# Patient Record
Sex: Female | Born: 2000
Health system: Southern US, Community
[De-identification: ages and names within clinical notes are randomized; demographics above are authoritative.]

## PROBLEM LIST (undated history)

## (undated) DIAGNOSIS — F419 Anxiety disorder, unspecified: Secondary | ICD-10-CM

## (undated) DIAGNOSIS — J302 Other seasonal allergic rhinitis: Secondary | ICD-10-CM

## (undated) DIAGNOSIS — J45909 Unspecified asthma, uncomplicated: Secondary | ICD-10-CM

## (undated) DIAGNOSIS — F32A Depression, unspecified: Secondary | ICD-10-CM

## (undated) HISTORY — DX: Other seasonal allergic rhinitis: J30.2

---
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
## 2020-01-14 DIAGNOSIS — F603 Borderline personality disorder: Secondary | ICD-10-CM | POA: Diagnosis not present

## 2020-01-21 DIAGNOSIS — F603 Borderline personality disorder: Secondary | ICD-10-CM | POA: Diagnosis not present

## 2020-02-12 DIAGNOSIS — F603 Borderline personality disorder: Secondary | ICD-10-CM | POA: Diagnosis not present

## 2020-03-11 DIAGNOSIS — F603 Borderline personality disorder: Secondary | ICD-10-CM | POA: Diagnosis not present

## 2020-03-18 DIAGNOSIS — F603 Borderline personality disorder: Secondary | ICD-10-CM | POA: Diagnosis not present

## 2020-03-19 ENCOUNTER — Ambulatory Visit: Payer: Self-pay

## 2020-03-20 ENCOUNTER — Telehealth (INDEPENDENT_AMBULATORY_CARE_PROVIDER_SITE_OTHER): Payer: BC Managed Care – PPO | Admitting: Family Medicine

## 2020-03-20 ENCOUNTER — Other Ambulatory Visit: Payer: Self-pay

## 2020-03-20 ENCOUNTER — Encounter: Payer: Self-pay | Admitting: Family Medicine

## 2020-03-20 DIAGNOSIS — F603 Borderline personality disorder: Secondary | ICD-10-CM | POA: Diagnosis not present

## 2020-03-20 DIAGNOSIS — N946 Dysmenorrhea, unspecified: Secondary | ICD-10-CM | POA: Diagnosis not present

## 2020-03-20 DIAGNOSIS — F32A Depression, unspecified: Secondary | ICD-10-CM

## 2020-03-20 DIAGNOSIS — F419 Anxiety disorder, unspecified: Secondary | ICD-10-CM

## 2020-03-20 DIAGNOSIS — F329 Major depressive disorder, single episode, unspecified: Secondary | ICD-10-CM

## 2020-03-20 MED ORDER — NORGESTIMATE-ETH ESTRADIOL 0.25-35 MG-MCG PO TABS: 1.0000 | ORAL_TABLET | Freq: Every day | ORAL | 1 refills | Status: DC

## 2020-03-20 MED ORDER — SERTRALINE HCL 100 MG PO TABS: 100.0000 mg | ORAL_TABLET | Freq: Every day | ORAL | 1 refills | Status: DC

## 2020-03-20 MED ORDER — BUPROPION HCL ER (XL) 150 MG PO TB24: 150.0000 mg | ORAL_TABLET | Freq: Every morning | ORAL | 1 refills | Status: DC

## 2020-03-20 NOTE — Progress Notes (Signed)
Virtual Visit via Video   I connected with patient on 03/20/20 at  8:00 AM EDT by a video enabled telemedicine application and verified that I am speaking with the correct person using two identifiers.  Location patient: Home Location provider: [facility], Office Persons participating in the virtual visit: Patient, Provider, CMA (Jess B)  I discussed the limitations of evaluation and management by telemedicine and the availability of in person appointments. The patient expressed understanding and agreed to proceed.  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  Majoring in [place].  Broke up w/ boyfriend just before summer.  A few months ago was 'dx'd w/ borderline personality disorder'.  Pt stopped seeing psych b/c 'she would change my prescription at every appt'.  Continues to see therapist regularly.  Currently on Bupropion XL 150mg  daily and Sertraline 100mg  daily.  Pt feels that medication is currently working and she feels in a good place.    Also needs refills on OCPs.  ROS:   See pertinent positives and negatives per HPI.  Patient Active Problem List   Diagnosis Date Noted   Dysmenorrhea in adolescent 11/10/2016   Anxiety and depression 11/10/2016   GERD (gastroesophageal reflux disease) 11/10/2016   Migraine without aura and without status migrainosus, not intractable 08/12/2016   Allergic reaction 11/07/2013    Social History   Tobacco Use   Smoking status: Never Smoker   Smokeless tobacco: Never Used  Substance Use Topics   Alcohol use: No    Current Outpatient Medications:    buPROPion (WELLBUTRIN XL) 150 MG 24 hr tablet, Take 150 mg by mouth every morning., Disp: , Rfl:    ferrous sulfate 325 (65 FE) MG tablet, Take 325 mg by mouth daily., Disp: , Rfl:    fexofenadine (ALLEGRA) 180 MG tablet, Take 180 mg by mouth daily., Disp: , Rfl:    norgestimate-ethinyl  estradiol (ESTARYLLA) 0.25-35 MG-MCG tablet, Take 1 tablet by mouth daily., Disp: 84 tablet, Rfl: 1   RISPERDAL 0.5 MG tablet, Take 0.5 mg by mouth 2 (two) times daily., Disp: , Rfl:    sertraline (ZOLOFT) 100 MG tablet, Take 100 mg by mouth daily., Disp: , Rfl:   Allergies  Allergen Reactions   Chocolate    Justicia Adhatoda Anaphylaxis   Peanut-Containing Drug Products Anaphylaxis   Avocado    Cat Hair Extract    Dog Epithelium    Grass Pollen(K-O-R-T-Swt Vern)    Other     Too much cheese/dairy- GI upset Tree nuts, Environmental Allergies, Feathers, Dogs, Cats    Peanut Butter Flavor Other (See Comments)    Throat pain and diarrhea    Pollen Extract     Objective:   There were no vitals taken for this visit.  AAOx3, NAD NCAT, EOMI No obvious CN deficits Coloring WNL Pt is able to speak clearly, coherently without shortness of breath or increased work of breathing.  Thought process is linear.  Mood is appropriate.   Assessment and Plan:   Anxiety/Depression- currently stable.  No longer seeing psychiatrist.  Asking for refills on Wellbutrin and Sertraline.  Not currently on mood stabilizer or anti-psychotic.  Doing well at college and is overall happy at this time.  Borderline personality disorder- new.  dx'd by therapist.  Pt feels that this explains 'a lot' about friendships and relationships.  Encouraged her to continue counseling.  Dysmenorrhea- pt needs refill on OCPs.   11/09/2013  Beverely Low, MD 03/20/2020

## 2020-03-20 NOTE — Progress Notes (Signed)
I have discussed the procedure for the virtual visit with the patient who has given consent to proceed with assessment and treatment.   Pt unable to obtain vitals.   Annette Hart, CMA     

## 2020-03-23 ENCOUNTER — Ambulatory Visit: Payer: Self-pay

## 2020-03-26 DIAGNOSIS — Z114 Encounter for screening for human immunodeficiency virus [HIV]: Secondary | ICD-10-CM | POA: Diagnosis not present

## 2020-03-26 DIAGNOSIS — J029 Acute pharyngitis, unspecified: Secondary | ICD-10-CM | POA: Diagnosis not present

## 2020-03-26 DIAGNOSIS — Z113 Encounter for screening for infections with a predominantly sexual mode of transmission: Secondary | ICD-10-CM | POA: Diagnosis not present

## 2020-03-26 DIAGNOSIS — J028 Acute pharyngitis due to other specified organisms: Secondary | ICD-10-CM | POA: Diagnosis not present

## 2020-04-01 DIAGNOSIS — J302 Other seasonal allergic rhinitis: Secondary | ICD-10-CM | POA: Diagnosis not present

## 2020-04-08 DIAGNOSIS — F603 Borderline personality disorder: Secondary | ICD-10-CM | POA: Diagnosis not present

## 2020-04-13 ENCOUNTER — Ambulatory Visit: Payer: Self-pay

## 2020-05-06 DIAGNOSIS — F603 Borderline personality disorder: Secondary | ICD-10-CM | POA: Diagnosis not present

## 2020-05-13 DIAGNOSIS — F603 Borderline personality disorder: Secondary | ICD-10-CM | POA: Diagnosis not present

## 2020-05-20 DIAGNOSIS — J069 Acute upper respiratory infection, unspecified: Secondary | ICD-10-CM | POA: Diagnosis not present

## 2020-05-21 DIAGNOSIS — Z113 Encounter for screening for infections with a predominantly sexual mode of transmission: Secondary | ICD-10-CM | POA: Diagnosis not present

## 2020-05-21 DIAGNOSIS — N899 Noninflammatory disorder of vagina, unspecified: Secondary | ICD-10-CM | POA: Diagnosis not present

## 2020-05-22 DIAGNOSIS — N899 Noninflammatory disorder of vagina, unspecified: Secondary | ICD-10-CM | POA: Diagnosis not present

## 2020-05-22 DIAGNOSIS — Z113 Encounter for screening for infections with a predominantly sexual mode of transmission: Secondary | ICD-10-CM | POA: Diagnosis not present

## 2020-05-27 ENCOUNTER — Encounter: Payer: Self-pay | Admitting: Physician Assistant

## 2020-05-27 ENCOUNTER — Telehealth (INDEPENDENT_AMBULATORY_CARE_PROVIDER_SITE_OTHER): Payer: BC Managed Care – PPO | Admitting: Physician Assistant

## 2020-05-27 ENCOUNTER — Other Ambulatory Visit: Payer: Self-pay

## 2020-05-27 ENCOUNTER — Telehealth: Payer: Self-pay | Admitting: Physician Assistant

## 2020-05-27 DIAGNOSIS — F603 Borderline personality disorder: Secondary | ICD-10-CM | POA: Diagnosis not present

## 2020-05-27 DIAGNOSIS — J029 Acute pharyngitis, unspecified: Secondary | ICD-10-CM

## 2020-05-27 MED ORDER — PREDNISONE 10 MG PO TABS: 30.0000 mg | ORAL_TABLET | Freq: Every day | ORAL | 0 refills | Status: DC

## 2020-05-27 MED ORDER — FLUCONAZOLE 150 MG PO TABS: 150.0000 mg | ORAL_TABLET | Freq: Once | ORAL | 0 refills | Status: AC

## 2020-05-27 MED ORDER — CEFDINIR 300 MG PO CAPS: 300.0000 mg | ORAL_CAPSULE | Freq: Two times a day (BID) | ORAL | 0 refills | Status: DC

## 2020-05-27 NOTE — Telephone Encounter (Signed)
Would you be willing to do an in office visit with this pt?   Annette Hart

## 2020-05-27 NOTE — Telephone Encounter (Signed)
At this time, we need to do respiratory illnesses via video visit to determine what the next best step is

## 2020-05-27 NOTE — Telephone Encounter (Signed)
Giving current URI symptoms -- would need to be video visit with me or she can see if PCP is willing to do in-office.

## 2020-05-27 NOTE — Telephone Encounter (Signed)
Pt called back asking if she can do a in office visit instead of a VV. She states that she had a covid test last week that came back neg. Please advise

## 2020-05-27 NOTE — Progress Notes (Signed)
Virtual Visit via Video   I connected with patient on 05/27/20 at  2:30 PM EST by a video enabled telemedicine application and verified that I am speaking with the correct person using two identifiers.  Location patient: Home Location provider: [place] [facility], Office Persons participating in the virtual visit: Patient, Provider, CMA (Patina Moore)  I discussed the limitations of evaluation and management by telemedicine and the availability of in person appointments. The patient expressed understanding and agreed to proceed.  Subjective:   HPI:   Patient presents via Care[facility] today c/o ongoing symptoms. Notes 2 weeks ago starting with mild sore throat, nasal congestion, fatigue and hoarseness of voice. Notes that her voice has returned. She is having fatigue, sore throat, bilateral swollen tonsils, odynophagia and aches. Notes decreased appetite. Denies fever but notes occasional sweating. Was COVID tested and this was negative. Denies loss of taste or smell. Denies chest congestion. Has been taking Mucinex-DM (not in past few days) and Tylenol for her sore throat.    ROS:  See pertinent positives and negatives per HPI.  Patient Active Problem List   Diagnosis Date Noted  . Borderline personality disorder (HCC) 03/20/2020  . Dysmenorrhea in adolescent 11/10/2016  . Anxiety and depression 11/10/2016  . GERD (gastroesophageal reflux disease) 11/10/2016  . Migraine without aura and without status migrainosus, not intractable 08/12/2016  . Allergic reaction 11/07/2013    Social History   Tobacco Use  . Smoking status: Never Smoker  . Smokeless tobacco: Never Used  Substance Use Topics  . Alcohol use: No    Current Outpatient Medications:  .  albuterol (VENTOLIN HFA) 108 (90 Base) MCG/ACT inhaler, Inhale 1-2 puffs into the lungs every 6 (six) hours as needed for wheezing or shortness of breath., Disp: , Rfl:  .  buPROPion (WELLBUTRIN XL) 150 MG 24 hr tablet, Take 1  tablet (150 mg total) by mouth every morning., Disp: 90 tablet, Rfl: 1 .  ferrous sulfate 325 (65 FE) MG tablet, Take 325 mg by mouth daily., Disp: , Rfl:  .  fexofenadine (ALLEGRA) 180 MG tablet, Take 180 mg by mouth daily., Disp: , Rfl:  .  norgestimate-ethinyl estradiol (ESTARYLLA) 0.25-35 MG-MCG tablet, Take 1 tablet by mouth daily., Disp: 84 tablet, Rfl: 1 .  sertraline (ZOLOFT) 100 MG tablet, Take 1 tablet (100 mg total) by mouth daily., Disp: 90 tablet, Rfl: 1  Allergies  Allergen Reactions  . Justicia Adhatoda Anaphylaxis  . Peanut-Containing Drug Products Anaphylaxis  . Avocado   . Cat Hair Extract   . Dog Epithelium   . Grass Pollen(K-O-R-T-Swt Vern)   . Other     Too much cheese/dairy- GI upset Tree nuts, Environmental Allergies, Feathers, Dogs, Cats   . Pollen Extract     Objective:   There were no vitals taken for this visit.  Patient is well-developed, well-nourished in no acute distress.  Resting comfortably at home.  Head is normocephalic, atraumatic.  No labored breathing.  Speech is clear and coherent with logical content.  Patient is alert and oriented at baseline.  + tonsillar exudate.   Assessment and Plan:   1. Pharyngitis, unspecified etiology Concern for bacterial pharyngitis giving current symptoms and exudate. Rx Cefdinir BID x 10 days and 3-day burst of Prednisone. Supportive measures and OTC medications reviewed. Follow-up if not improving.  - cefdinir (OMNICEF) 300 MG capsule; Take 1 capsule (300 mg total) by mouth 2 (two) times daily.  Dispense: 20 capsule; Refill: 0 - predniSONE (DELTASONE) 10 MG tablet;  Take 3 tablets (30 mg total) by mouth daily with breakfast.  Dispense: 9 tablet; Refill: 0    Piedad Climes, [place] 05/27/2020

## 2020-05-27 NOTE — Progress Notes (Signed)
I have discussed the procedure for the virtual visit with the patient who has given consent to proceed with assessment and treatment.   Tydarius Yawn S Makinsley Schiavi, CMA     

## 2020-05-27 NOTE — Telephone Encounter (Signed)
Pt is aware.  

## 2020-05-28 ENCOUNTER — Telehealth: Payer: Self-pay | Admitting: Family Medicine

## 2020-05-29 ENCOUNTER — Telehealth: Payer: Self-pay | Admitting: Family Medicine

## 2020-06-10 DIAGNOSIS — F603 Borderline personality disorder: Secondary | ICD-10-CM | POA: Diagnosis not present

## 2020-06-14 ENCOUNTER — Other Ambulatory Visit: Payer: Self-pay

## 2020-06-14 ENCOUNTER — Ambulatory Visit
Admission: EM | Admit: 2020-06-14 | Discharge: 2020-06-14 | Disposition: A | Discharge status: Home / Self Care | Payer: BC Managed Care – PPO | Attending: Urgent Care | Admitting: Urgent Care

## 2020-06-14 DIAGNOSIS — R1310 Dysphagia, unspecified: Secondary | ICD-10-CM | POA: Insufficient documentation

## 2020-06-14 DIAGNOSIS — R070 Pain in throat: Secondary | ICD-10-CM | POA: Insufficient documentation

## 2020-06-14 DIAGNOSIS — J029 Acute pharyngitis, unspecified: Secondary | ICD-10-CM | POA: Insufficient documentation

## 2020-06-14 DIAGNOSIS — J358 Other chronic diseases of tonsils and adenoids: Secondary | ICD-10-CM | POA: Diagnosis not present

## 2020-06-14 LAB — POCT RAPID STREP A (OFFICE): Rapid Strep A Screen: NEGATIVE

## 2020-06-14 MED ORDER — CLINDAMYCIN HCL 300 MG PO CAPS: 300.0000 mg | ORAL_CAPSULE | Freq: Three times a day (TID) | ORAL | 0 refills | Status: DC

## 2020-06-14 MED ORDER — PREDNISONE 20 MG PO TABS: ORAL_TABLET | ORAL | 0 refills | Status: DC

## 2020-06-14 MED ORDER — FLUCONAZOLE 150 MG PO TABS: 150.0000 mg | ORAL_TABLET | ORAL | 0 refills | Status: DC

## 2020-06-14 NOTE — ED Triage Notes (Signed)
Patient complains of worsening sore throat pain since Thursday and is just recovering from laryngitis. Pt states the pain has been worsening and it is now extremely painful to swallow eat or drink. Pt is aox4 and ambulatory.

## 2020-06-14 NOTE — Discharge Instructions (Signed)
Please call [facility] for a consultation regarding frequent tonsillitis and laryngitis.

## 2020-06-14 NOTE — ED Provider Notes (Signed)
[facility]   MRN: [id number] DOB: 12/26/2000  Subjective:   Annette Hart is a 19 y.o. female presenting for 3-day history of recurrent throat pain, painful swallowing, neck pain.  Patient has been struggling with laryngitis and frequent strep infections.  States that she has had about 5 this year already.  She has undergone a course of cefdinir and 3 days of prednisone and had some relief up until this past Thursday.  She has not seen a throat specialist.  Denies fever, cough, chest pain.  Is having significant difficulty eating and drinking.  No current facility-administered medications for this encounter.  Current Outpatient Medications:  .  albuterol (VENTOLIN HFA) 108 (90 Base) MCG/ACT inhaler, Inhale 1-2 puffs into the lungs every 6 (six) hours as needed for wheezing or shortness of breath., Disp: , Rfl:  .  buPROPion (WELLBUTRIN XL) 150 MG 24 hr tablet, Take 1 tablet (150 mg total) by mouth every morning., Disp: 90 tablet, Rfl: 1 .  fexofenadine (ALLEGRA) 180 MG tablet, Take 180 mg by mouth daily., Disp: , Rfl:  .  norgestimate-ethinyl estradiol (ESTARYLLA) 0.25-35 MG-MCG tablet, Take 1 tablet by mouth daily., Disp: 84 tablet, Rfl: 1 .  sertraline (ZOLOFT) 100 MG tablet, Take 1 tablet (100 mg total) by mouth daily., Disp: 90 tablet, Rfl: 1 .  cefdinir (OMNICEF) 300 MG capsule, Take 1 capsule (300 mg total) by mouth 2 (two) times daily., Disp: 20 capsule, Rfl: 0 .  ferrous sulfate 325 (65 FE) MG tablet, Take 325 mg by mouth daily., Disp: , Rfl:  .  predniSONE (DELTASONE) 10 MG tablet, Take 3 tablets (30 mg total) by mouth daily with breakfast., Disp: 9 tablet, Rfl: 0   Allergies  Allergen Reactions  . Justicia Adhatoda Anaphylaxis  . Peanut-Containing Drug Products Anaphylaxis  . Avocado   . Cat Hair Extract   . Dog Epithelium   . Grass Pollen(K-O-R-T-Swt Vern)   . Other     Too much cheese/dairy- GI upset Tree nuts, Environmental Allergies, Feathers, Dogs,  Cats   . Pollen Extract     Past Medical History:  Diagnosis Date  . Seasonal allergies      Past Surgical History:  Procedure Laterality Date  . UMBILICAL HERNIA REPAIR  2003    Family History  Problem Relation Age of Onset  . Hypertension Maternal Grandmother   . Diabetes Maternal Grandfather   . Arthritis Father   . Cataracts Father   . Glaucoma Father   . Migraines Father   . Headache Father   . Hypothyroidism Mother   . Headache Mother   . Migraines Mother   . Depression Mother   . Anxiety disorder Mother   . Schizophrenia Paternal Uncle   . Autism Other   . Seizures Neg Hx   . Bipolar disorder Neg Hx   . ADD / ADHD Neg Hx     Social History   Tobacco Use  . Smoking status: Never Smoker  . Smokeless tobacco: Never Used  Vaping Use  . Vaping Use: Never used  Substance Use Topics  . Alcohol use: No  . Drug use: No    ROS   Objective:   Vitals: BP 114/76 (BP Location: Right Arm)   Pulse 80   Temp 98.2 F (36.8 C) (Oral)   Resp 20   LMP 06/09/2020 (Approximate)   SpO2 98%   Physical Exam Constitutional:      General: She is not in acute distress.    Appearance: Normal  appearance. She is well-developed. She is not ill-appearing, toxic-appearing or diaphoretic.  HENT:     Head: Normocephalic and atraumatic.     Nose: Nose normal.     Mouth/Throat:     Mouth: Mucous membranes are moist.     Pharynx: Pharyngeal swelling and posterior oropharyngeal erythema present. No oropharyngeal exudate or uvula swelling.     Tonsils: No tonsillar exudate or tonsillar abscesses. 1+ on the right. 1+ on the left.  Eyes:     General: No scleral icterus.    Extraocular Movements: Extraocular movements intact.     Pupils: Pupils are equal, round, and reactive to light.  Cardiovascular:     Rate and Rhythm: Normal rate.  Pulmonary:     Effort: Pulmonary effort is normal.  Lymphadenopathy:     Cervical: Cervical adenopathy (bilaterally) present.  Skin:     General: Skin is warm and dry.  Neurological:     General: No focal deficit present.     Mental Status: She is alert and oriented to person, place, and time.  Psychiatric:        Mood and Affect: Mood normal.        Behavior: Behavior normal.     Results for orders placed or performed during the hospital encounter of 06/14/20 (from the past 24 hour(s))  POCT rapid strep A     Status: None   Collection Time: 06/14/20  9:31 AM  Result Value Ref Range   Rapid Strep A Screen Negative Negative    Assessment and Plan :   PDMP not reviewed this encounter.  1. Pharyngitis, unspecified etiology   2. Throat pain   3. Tonsillar erythema   4. Painful swallowing     Strep culture pending, will manage for pharyngitis of a different infectious etiology with clindamycin.  Recommended oral prednisone course in addition to this.  Emphasized need for follow-up with an ENT specialist. Counseled patient on potential for adverse effects with medications prescribed/recommended today, ER and return-to-clinic precautions discussed, patient verbalized understanding.    Wallis Bamberg, PA-C 06/14/20 1002

## 2020-06-16 LAB — CULTURE, GROUP A STREP (THRC)

## 2020-06-23 ENCOUNTER — Encounter: Payer: Self-pay | Admitting: Family Medicine

## 2020-06-23 ENCOUNTER — Ambulatory Visit (INDEPENDENT_AMBULATORY_CARE_PROVIDER_SITE_OTHER): Payer: BC Managed Care – PPO | Admitting: Family Medicine

## 2020-06-23 ENCOUNTER — Other Ambulatory Visit: Payer: Self-pay

## 2020-06-23 VITALS — BP 140/60 | HR 113 | Temp 98.6°F | Resp 17 | Ht 66.0 in | Wt 124.8 lb

## 2020-06-23 DIAGNOSIS — Z23 Encounter for immunization: Secondary | ICD-10-CM

## 2020-06-23 DIAGNOSIS — Z0000 Encounter for general adult medical examination without abnormal findings: Secondary | ICD-10-CM

## 2020-06-23 NOTE — Assessment & Plan Note (Signed)
PE WNL.  Pt has multiple concerns but has specialists for these issues (ENT for tonsils, eye doctor for L eye visual changes, GYN for menstrual issues).  Encouraged her to call and schedule appts to address these concerns.  No need for labs today based on lack of risk factors.  Flu shot given.  Anticipatory guidance provided.

## 2020-06-23 NOTE — Patient Instructions (Signed)
Follow up in 1 year or as needed No need for blood work today!  Annette Hart! Please call and schedule with ENT, eye doctor, and GYN Make sure you are drinking LOTS of water, limit your salt intake to improve blood pressure Call with any questions or concerns Stay Safe!  Stay Healthy! Happy Holidays!!

## 2020-06-23 NOTE — Progress Notes (Signed)
   Subjective:    Patient ID: Annette Hart, female    DOB: [birth year], 19 y.o.   MRN: [id number]  HPI CPE- UTD on Tdap, COVID.  Due for flu.  Too young for pap.  Reviewed past medical, surgical, family and social histories.   Health Maintenance  Topic Date Due  . INFLUENZA VACCINE  02/09/2020  . Hepatitis C Screening  03/20/2021 (Originally Aug 11, 2000)  . HIV Screening  03/20/2021 (Originally 05/07/2016)  . TETANUS/TDAP  01/15/2022  . COVID-19 Vaccine  Completed      Review of Systems Patient reports no hearing changes, adenopathy, fever, weight change,  persistant/recurrent hoarseness , swallowing issues, chest pain, palpitations, edema, persistant/recurrent cough, hemoptysis, dyspnea (rest/exertional/paroxysmal nocturnal), gastrointestinal bleeding (melena, rectal bleeding), abdominal pain, bowel changes, GU symptoms (dysuria, hematuria, incontinence), Gyn symptoms (abnormal  bleeding, pain),  syncope, focal weakness, memory loss, numbness & tingling, skin/hair/nail changes, abnormal bruising or bleeding.   dx'd w/ borderline personality d/o last spring L eye having difficulty differentiating colors + GERD + intermittent abdominal pain + constant back pain- worse w/ menstrual cycle  This visit occurred during the SARS-CoV-2 public health emergency.  Safety protocols were in place, including screening questions prior to the visit, additional usage of staff PPE, and extensive cleaning of exam room while observing appropriate contact time as indicated for disinfecting solutions.       Objective:   Physical Exam General Appearance:    Alert, cooperative, no distress, appears stated age  Head:    Normocephalic, without obvious abnormality, atraumatic  Eyes:    PERRL, conjunctiva/corneas clear, EOM's intact, fundi    benign, both eyes  Ears:    Normal TM's and external ear canals, both ears  Nose:   Deferred due to COVID  Throat:   Neck:   Supple, symmetrical, trachea midline,  no adenopathy;    Thyroid: no enlargement/tenderness/nodules  Back:     Symmetric, no curvature, ROM normal, no CVA tenderness  Lungs:     Clear to auscultation bilaterally, respirations unlabored  Chest Wall:    No tenderness or deformity   Heart:    Regular rate and rhythm, S1 and S2 normal, no murmur, rub   or gallop  Breast Exam:    Deferred to GYN  Abdomen:     Soft, non-tender, bowel sounds active all four quadrants,    no masses, no organomegaly  Genitalia:    Deferred to GYN  Rectal:    Extremities:   Extremities normal, atraumatic, no cyanosis or edema  Pulses:   2+ and symmetric all extremities  Skin:   Skin color, texture, turgor normal, no rashes or lesions  Lymph nodes:   Cervical, supraclavicular, and axillary nodes normal  Neurologic:   CNII-XII intact, normal strength, sensation and reflexes    throughout          Assessment & Plan:

## 2020-06-24 DIAGNOSIS — F603 Borderline personality disorder: Secondary | ICD-10-CM | POA: Diagnosis not present

## 2020-06-29 ENCOUNTER — Encounter: Payer: Self-pay | Admitting: *Deleted

## 2020-06-29 ENCOUNTER — Encounter: Payer: Self-pay | Admitting: Obstetrics & Gynecology

## 2020-06-29 NOTE — Progress Notes (Unsigned)
[facility] GYn visit for cycle issues. Per discussion with provider do not need to call patient ; but may be rescheduled if she calls. Raphael Espe,RN

## 2020-07-29 DIAGNOSIS — N899 Noninflammatory disorder of vagina, unspecified: Secondary | ICD-10-CM | POA: Diagnosis not present

## 2020-07-29 DIAGNOSIS — Z114 Encounter for screening for human immunodeficiency virus [HIV]: Secondary | ICD-10-CM | POA: Diagnosis not present

## 2020-07-29 DIAGNOSIS — Z113 Encounter for screening for infections with a predominantly sexual mode of transmission: Secondary | ICD-10-CM | POA: Diagnosis not present

## 2020-08-24 DIAGNOSIS — R102 Pelvic and perineal pain: Secondary | ICD-10-CM | POA: Diagnosis not present

## 2020-09-06 ENCOUNTER — Emergency Department (HOSPITAL_BASED_OUTPATIENT_CLINIC_OR_DEPARTMENT_OTHER)
Admission: EM | Admit: 2020-09-06 | Discharge: 2020-09-06 | Disposition: A | Discharge status: Home / Self Care | Payer: BC Managed Care – PPO | Attending: Emergency Medicine | Admitting: Emergency Medicine

## 2020-09-06 ENCOUNTER — Emergency Department (HOSPITAL_BASED_OUTPATIENT_CLINIC_OR_DEPARTMENT_OTHER): Payer: BC Managed Care – PPO

## 2020-09-06 ENCOUNTER — Encounter (HOSPITAL_BASED_OUTPATIENT_CLINIC_OR_DEPARTMENT_OTHER): Payer: Self-pay | Admitting: Emergency Medicine

## 2020-09-06 ENCOUNTER — Other Ambulatory Visit: Payer: Self-pay

## 2020-09-06 DIAGNOSIS — Z91010 Allergy to peanuts: Secondary | ICD-10-CM | POA: Insufficient documentation

## 2020-09-06 DIAGNOSIS — N898 Other specified noninflammatory disorders of vagina: Secondary | ICD-10-CM | POA: Diagnosis not present

## 2020-09-06 DIAGNOSIS — J45909 Unspecified asthma, uncomplicated: Secondary | ICD-10-CM | POA: Insufficient documentation

## 2020-09-06 DIAGNOSIS — M545 Low back pain, unspecified: Secondary | ICD-10-CM | POA: Insufficient documentation

## 2020-09-06 DIAGNOSIS — R102 Pelvic and perineal pain: Secondary | ICD-10-CM | POA: Diagnosis not present

## 2020-09-06 HISTORY — DX: Unspecified asthma, uncomplicated: J45.909

## 2020-09-06 LAB — URINALYSIS, MICROSCOPIC (REFLEX)

## 2020-09-06 LAB — CBC WITH DIFFERENTIAL/PLATELET
Abs Immature Granulocytes: 0.02 10*3/uL (ref 0.00–0.07)
Basophils Absolute: 0 10*3/uL (ref 0.0–0.1)
Basophils Relative: 0 %
Eosinophils Absolute: 0.2 10*3/uL (ref 0.0–0.5)
Eosinophils Relative: 2 %
HCT: 42.7 % (ref 36.0–46.0)
Hemoglobin: 13.2 g/dL (ref 12.0–15.0)
Immature Granulocytes: 0 %
Lymphocytes Relative: 20 %
Lymphs Abs: 1.9 10*3/uL (ref 0.7–4.0)
MCH: 25.5 pg — ABNORMAL LOW (ref 26.0–34.0)
MCHC: 30.9 g/dL (ref 30.0–36.0)
MCV: 82.6 fL (ref 80.0–100.0)
Monocytes Absolute: 0.7 10*3/uL (ref 0.1–1.0)
Monocytes Relative: 7 %
Neutro Abs: 6.5 10*3/uL (ref 1.7–7.7)
Neutrophils Relative %: 71 %
Platelets: 344 10*3/uL (ref 150–400)
RBC: 5.17 MIL/uL — ABNORMAL HIGH (ref 3.87–5.11)
RDW: 13.8 % (ref 11.5–15.5)
WBC: 9.3 10*3/uL (ref 4.0–10.5)
nRBC: 0 % (ref 0.0–0.2)

## 2020-09-06 LAB — BASIC METABOLIC PANEL
Anion gap: 9 (ref 5–15)
BUN: 10 mg/dL (ref 6–20)
CO2: 25 mmol/L (ref 22–32)
Calcium: 9.1 mg/dL (ref 8.9–10.3)
Chloride: 104 mmol/L (ref 98–111)
Creatinine, Ser: 0.85 mg/dL (ref 0.44–1.00)
GFR, Estimated: >60 mL/min (ref >60)
Glucose, Bld: 93 mg/dL (ref 70–99)
Potassium: 4.1 mmol/L (ref 3.5–5.1)
Sodium: 138 mmol/L (ref 135–145)

## 2020-09-06 LAB — WET PREP, GENITAL
Clue Cells Wet Prep HPF POC: NONE SEEN
Sperm: NONE SEEN
Trich, Wet Prep: NONE SEEN
Yeast Wet Prep HPF POC: NONE SEEN

## 2020-09-06 LAB — URINALYSIS, ROUTINE W REFLEX MICROSCOPIC
Bilirubin Urine: NEGATIVE
Glucose, UA: NEGATIVE mg/dL
Ketones, ur: NEGATIVE mg/dL
Nitrite: NEGATIVE
Protein, ur: NEGATIVE mg/dL
Specific Gravity, Urine: 1.03 (ref 1.005–1.030)
pH: 5 (ref 5.0–8.0)

## 2020-09-06 LAB — PREGNANCY, URINE: Preg Test, Ur: NEGATIVE

## 2020-09-06 MED ORDER — NAPROXEN 375 MG PO TABS: 375.0000 mg | ORAL_TABLET | Freq: Two times a day (BID) | ORAL | 0 refills | Status: DC

## 2020-09-06 NOTE — ED Notes (Signed)
ED Provider at bedside. 

## 2020-09-06 NOTE — ED Provider Notes (Signed)
Care of the patient assumed at the change of shift. She is here for pelvic pain, chronic since menarche but more severe and persistent in recent weeks. She saw Ob/Gyn 2 weeks ago and had pelvic exam done then including STI checks which she reports was negative. She was scheduled for outpatient [place] later this week but woke up with severe pain and decided to come to the ED for evaluation. Her pain is minimal at the time of my re-evaluation.   Physical Exam  BP 116/71   Pulse 88   Temp 98.2 F (36.8 C) (Oral)   Resp 16   Ht 5\' 5"  (1.651 m)   Wt 58.1 kg   LMP 08/31/2020   SpO2 100%   BMI 21.30 kg/m   Physical Exam  ED Course/Procedures     Procedures  MDM  Labs reviewed, UA with signs of UTI, will send for culture. Dr. 09/02/2020 reported some mild vaginal discharge on pelvic exam, however wet prep is neg and patient reports negative STI check at recent Ob/Gyn visit, declines empiric treatment today. Judd Lien is neg for signs of ovarian torsion, TOA or other acute process to explain her pain. Could be endometriosis or other cause of pelvic pain that cannot be definitively diagnosed today. Plan discharge home with continued outpatient follow up. She has tried APAP and Motrin in the past without improvement, will provide Rx for Naprosyn 500mg  to see if this is helpful for her in the meantime.        [place], MD 09/06/20 [phone]

## 2020-09-06 NOTE — ED Notes (Signed)
Pt at U/S

## 2020-09-06 NOTE — ED Triage Notes (Addendum)
Reports hx of "bad periods", states she saw her OBGYN last Monday for same. States her period has ended now but the pain is persistent. She says she is unable to do anything due to pain. She is scheduled for an ultrasound 3/3 but "can't wait that long". Also noted whiteish-yellow discharge since yesterday. She is sexually active.

## 2020-09-06 NOTE — ED Provider Notes (Signed)
[facility] EMERGENCY DEPARTMENT Provider Note   CSN: [id number] Arrival date & time: 09/06/20  7858     History Chief Complaint  Patient presents with   Abdominal Pain    Annette Hart is a 20 y.o. female.  Patient is a 20 year old female with past medical history of anxiety, depression, borderline personality disorder, and dysmenorrhea.  Patient states she has had painful, irregular periods since she was 20 years old.  Patient states that she has been having worsening low back and pelvic pain with her menstrual periods recently.  She was seen by GYN Dr. 2 weeks ago and was scheduled for an ultrasound on March 3.  She presents today stating that she is having vaginal discharge since yesterday and does not feel as though she can wait until March 3 for her ultrasound.  Patient is sexually active, but denies new partners.  The history is provided by the patient.  Abdominal Pain Pain location:  Suprapubic, LLQ and RLQ Pain quality: cramping   Pain radiates to:  Does not radiate Pain severity:  Moderate Timing:  Constant Progression:  Worsening      Past Medical History:  Diagnosis Date   Asthma    Seasonal allergies     Patient Active Problem List   Diagnosis Date Noted   Physical exam 06/23/2020   Borderline personality disorder (HCC) 03/20/2020   Dysmenorrhea in adolescent 11/10/2016   Anxiety and depression 11/10/2016   GERD (gastroesophageal reflux disease) 11/10/2016   Migraine without aura and without status migrainosus, not intractable 08/12/2016   Allergic reaction 11/07/2013    Past Surgical History:  Procedure Laterality Date   UMBILICAL HERNIA REPAIR  2003     OB History   No obstetric history on file.     Family History  Problem Relation Age of Onset   Hypertension Maternal Grandmother    Diabetes Maternal Grandfather    Arthritis Father    Cataracts Father    Glaucoma Father    Migraines Father    Headache Father     Hypothyroidism Mother    Headache Mother    Migraines Mother    Depression Mother    Anxiety disorder Mother    Schizophrenia Paternal Uncle    Autism Other    Seizures Neg Hx    Bipolar disorder Neg Hx    ADD / ADHD Neg Hx     Social History   Tobacco Use   Smoking status: Never Smoker   Smokeless tobacco: Never Used  Vaping Use   Vaping Use: Some days  Substance Use Topics   Alcohol use: No   Drug use: Yes    Types: Marijuana    Comment: "once a month"    Home Medications Prior to Admission medications   Medication Sig Start Date End Date Taking? Authorizing Provider  albuterol (VENTOLIN HFA) 108 (90 Base) MCG/ACT inhaler Inhale 1-2 puffs into the lungs every 6 (six) hours as needed for wheezing or shortness of breath.    [provider]  buPROPion (WELLBUTRIN XL) 150 MG 24 hr tablet Take 1 tablet (150 mg total) by mouth every morning. 03/20/20   Sheliah Hatch, MD  clindamycin (CLEOCIN) 300 MG capsule Take 1 capsule (300 mg total) by mouth 3 (three) times daily. 06/14/20   Wallis Bamberg, PA-C  ferrous sulfate 325 (65 FE) MG tablet Take 325 mg by mouth daily.    [provider]  fexofenadine (ALLEGRA) 180 MG tablet Take 180 mg by mouth daily.  [provider]  norgestimate-ethinyl estradiol (ESTARYLLA) 0.25-35 MG-MCG tablet Take 1 tablet by mouth daily. 03/20/20   Sheliah Hatch, MD  predniSONE (DELTASONE) 20 MG tablet Take 2 tablets daily with breakfast. 06/14/20   Wallis Bamberg, PA-C  sertraline (ZOLOFT) 100 MG tablet Take 1 tablet (100 mg total) by mouth daily. 03/20/20   Sheliah Hatch, MD    Allergies    Bevelyn Buckles, Peanut-containing drug products, Avocado, Cat hair extract, Dog epithelium, Grass pollen(k-o-r-t-swt vern), Other, and Pollen extract  Review of Systems   Review of Systems  Gastrointestinal: Positive for abdominal pain.  All other systems reviewed and are negative.   Physical Exam Updated  Vital Signs BP 112/79 (BP Location: Right Arm)    Pulse 82    Temp 98.8 F (37.1 C) (Oral)    Resp 19    Ht 5\' 5"  (1.651 m)    Wt 58.1 kg    LMP 08/31/2020    SpO2 99%    BMI 21.30 kg/m   Physical Exam Vitals and nursing note reviewed.  Constitutional:      General: She is not in acute distress.    Appearance: She is well-developed and well-nourished. She is not diaphoretic.  HENT:     Head: Normocephalic and atraumatic.  Cardiovascular:     Rate and Rhythm: Normal rate and regular rhythm.     Heart sounds: No murmur heard. No friction rub. No gallop.   Pulmonary:     Effort: Pulmonary effort is normal. No respiratory distress.     Breath sounds: Normal breath sounds. No wheezing.  Abdominal:     General: Bowel sounds are normal. There is no distension.     Palpations: Abdomen is soft.     Tenderness: There is abdominal tenderness in the right lower quadrant, suprapubic area and left lower quadrant. There is no right CVA tenderness, left CVA tenderness, guarding or rebound.  Genitourinary:    Vagina: Vaginal discharge and tenderness present.     Comments: The external genitalia is unremarkable in appearance.  There is some yellowish discharge present.  There is bilateral adnexal tenderness, however no palpable masses.  There is no cervical motion tenderness. Musculoskeletal:        General: Normal range of motion.     Cervical back: Normal range of motion and neck supple.  Skin:    General: Skin is warm and dry.  Neurological:     Mental Status: She is alert and oriented to person, place, and time.     ED Results / Procedures / Treatments   Labs (all labs ordered are listed, but only abnormal results are displayed) Labs Reviewed - No data to display  EKG None  Radiology No results found.  Procedures Procedures   Medications Ordered in ED Medications - No data to display  ED Course  I have reviewed the triage vital signs and the nursing notes.  Pertinent labs &  imaging results that were available during my care of the patient were reviewed by me and considered in my medical decision making (see chart for details).    MDM Rules/Calculators/A&P  Patient presenting with vaginal discharge and pelvic pain as well as irregular periods.  Work-up initiated including laboratory studies, urinalysis, ultrasound and all are pending at this time.  Pelvic examination performed and GC/chlamydia and wet prep are pending.  Care to be signed out to Dr. 09/02/2020 at shift change.  He will obtain the results of the above studies and determine  the final disposition.  Final Clinical Impression(s) / ED Diagnoses Final diagnoses:  Pelvic pain    Rx / DC Orders ED Discharge Orders    None       Geoffery Lyons, MD 09/07/20 [phone]

## 2020-09-06 NOTE — ED Notes (Signed)
EDP at bedside  

## 2020-09-07 ENCOUNTER — Emergency Department (HOSPITAL_BASED_OUTPATIENT_CLINIC_OR_DEPARTMENT_OTHER): Payer: BC Managed Care – PPO

## 2020-09-07 ENCOUNTER — Emergency Department (HOSPITAL_BASED_OUTPATIENT_CLINIC_OR_DEPARTMENT_OTHER)
Admission: EM | Admit: 2020-09-07 | Discharge: 2020-09-07 | Disposition: A | Discharge status: Home / Self Care | Payer: BC Managed Care – PPO | Attending: Emergency Medicine | Admitting: Emergency Medicine

## 2020-09-07 ENCOUNTER — Encounter (HOSPITAL_BASED_OUTPATIENT_CLINIC_OR_DEPARTMENT_OTHER): Payer: Self-pay

## 2020-09-07 ENCOUNTER — Other Ambulatory Visit: Payer: Self-pay

## 2020-09-07 DIAGNOSIS — R1030 Lower abdominal pain, unspecified: Secondary | ICD-10-CM | POA: Diagnosis not present

## 2020-09-07 DIAGNOSIS — M549 Dorsalgia, unspecified: Secondary | ICD-10-CM | POA: Insufficient documentation

## 2020-09-07 DIAGNOSIS — Z91010 Allergy to peanuts: Secondary | ICD-10-CM | POA: Insufficient documentation

## 2020-09-07 DIAGNOSIS — R1110 Vomiting, unspecified: Secondary | ICD-10-CM | POA: Diagnosis not present

## 2020-09-07 DIAGNOSIS — J45909 Unspecified asthma, uncomplicated: Secondary | ICD-10-CM | POA: Diagnosis not present

## 2020-09-07 DIAGNOSIS — R109 Unspecified abdominal pain: Secondary | ICD-10-CM | POA: Diagnosis not present

## 2020-09-07 DIAGNOSIS — K219 Gastro-esophageal reflux disease without esophagitis: Secondary | ICD-10-CM | POA: Diagnosis not present

## 2020-09-07 DIAGNOSIS — K5900 Constipation, unspecified: Secondary | ICD-10-CM

## 2020-09-07 DIAGNOSIS — F17290 Nicotine dependence, other tobacco product, uncomplicated: Secondary | ICD-10-CM | POA: Diagnosis not present

## 2020-09-07 DIAGNOSIS — R1084 Generalized abdominal pain: Secondary | ICD-10-CM | POA: Diagnosis not present

## 2020-09-07 LAB — URINALYSIS, ROUTINE W REFLEX MICROSCOPIC
Bilirubin Urine: NEGATIVE
Glucose, UA: NEGATIVE mg/dL
Hgb urine dipstick: NEGATIVE
Ketones, ur: NEGATIVE mg/dL
Leukocytes,Ua: NEGATIVE
Nitrite: NEGATIVE
Protein, ur: NEGATIVE mg/dL
Specific Gravity, Urine: 1.01 (ref 1.005–1.030)
pH: 7 (ref 5.0–8.0)

## 2020-09-07 LAB — GC/CHLAMYDIA PROBE AMP (~~LOC~~) NOT AT ARMC
Chlamydia: NEGATIVE
Comment: NEGATIVE
Comment: NORMAL
Neisseria Gonorrhea: POSITIVE — AB

## 2020-09-07 MED ORDER — IOHEXOL 300 MG/ML  SOLN
80.0000 mL | Freq: Once | INTRAMUSCULAR | Status: AC | PRN
  Administered 2020-09-07: 80 mL via INTRAVENOUS

## 2020-09-07 MED ORDER — MORPHINE SULFATE (PF) 4 MG/ML IV SOLN
4.0000 mg | Freq: Once | INTRAVENOUS | Status: AC
Start: 2020-09-07 — End: 2020-09-07
  Administered 2020-09-07: 4 mg via INTRAVENOUS
  Filled 2020-09-07: qty 1

## 2020-09-07 MED ORDER — ONDANSETRON HCL 4 MG/2ML IJ SOLN
4.0000 mg | Freq: Once | INTRAMUSCULAR | Status: AC
  Administered 2020-09-07: 4 mg via INTRAVENOUS
  Filled 2020-09-07: qty 2

## 2020-09-07 MED ORDER — KETOROLAC TROMETHAMINE 15 MG/ML IJ SOLN
15.0000 mg | Freq: Once | INTRAMUSCULAR | Status: AC
  Administered 2020-09-07: 15 mg via INTRAVENOUS
  Filled 2020-09-07: qty 1

## 2020-09-07 NOTE — ED Notes (Signed)
Taken to CT.

## 2020-09-07 NOTE — ED Provider Notes (Signed)
Patient signed out to me with abdominal pain.  CT scan negative for appendicitis or inflammatory process however does appear to show fair amount of stool burden.  Overall suspect constipation and gas pain is the main cause of her symptoms.  Previous doctor thought possibly gynecology related pain as she does have some irregular menstrual cycles.  Urinalysis was negative for infection.  Overall recommend that she continue MiraLAX at home with increased dose until desired effect.  If still having issues after having resolved constipation suspect that follow-up with primary care and OB/GYN would be reasonable.  Discharged in ED in good condition.  This chart was dictated using voice recognition software.  Despite best efforts to proofread,  errors can occur which can change the documentation meaning.     Virgina Norfolk, DO 09/07/20 [phone]

## 2020-09-07 NOTE — ED Triage Notes (Signed)
Pt Here POV with Back Pain and Cramps.   Pt was seen here yesterday for same and was wold to return if pain worsened or if Patient vomited.  Pain is now worse and patient did have one episode of emesis.  A&Ox4, GCS 15. Ambulatory.

## 2020-09-07 NOTE — Discharge Instructions (Addendum)
Suspect that your pain is likely from constipation.  Continue MiraLAX at home.  Consider increasing dose to 2 or 3 capfuls of MiraLAX every 12 hours until desired effect.  Continue to take some MiraLAX daily until you are having nice and easy stools.  If pain still occurring after having good bowel movements consider following up with your primary care doctor/OB/GYN.

## 2020-09-07 NOTE — ED Provider Notes (Signed)
[facility] EMERGENCY DEPARTMENT Provider Note   CSN: [id number] Arrival date & time: 09/07/20  0449     History Chief Complaint  Patient presents with  . Back Pain    Annette Hart is a 20 y.o. female.  HPI     This is a 20 year old female with a history of borderline personality, dysmenorrhea, asthma who presents with recurrent abdominal pain.  Patient was evaluated yesterday morning for the same.  At that time she had lab work and ultrasound imaging that was largely reassuring.  Per the patient, she has had worsened back and lower pelvic pain since Saturday morning.  She has had pain all week since onset of her menstrual cycle.  She has a history of the same.  However, her menstrual cycle ended on Thursday and she has had ongoing pain.  She was seen and evaluated by her OB/GYN 2 weeks ago with pelvic exam and STD check.  Patient states that she got home yesterday and took naproxen.  She states initially she thought it helped but she woke up again with significant pain.  She rates her pain 8 out of 10.  It is "like a belt" across her back and lower abdomen.  She denies any urinary symptoms.  She denies lateralizing pain.  Rates her pain at 9 out of 10.  She reports having a bowel movement after taking a laxative and has had 1 episode of nonbilious, nonbloody emesis.  Chart reviewed.  Ultrasound negative for ovarian torsion or TOA.  Urinalysis did have white cells and bacteria but patient does not have any urinary symptoms.  Past Medical History:  Diagnosis Date  . Asthma   . Seasonal allergies     Patient Active Problem List   Diagnosis Date Noted  . Physical exam 06/23/2020  . Borderline personality disorder (HCC) 03/20/2020  . Dysmenorrhea in adolescent 11/10/2016  . Anxiety and depression 11/10/2016  . GERD (gastroesophageal reflux disease) 11/10/2016  . Migraine without aura and without status migrainosus, not intractable 08/12/2016  . Allergic reaction  11/07/2013    Past Surgical History:  Procedure Laterality Date  . UMBILICAL HERNIA REPAIR  2003     OB History   No obstetric history on file.     Family History  Problem Relation Age of Onset  . Hypertension Maternal Grandmother   . Diabetes Maternal Grandfather   . Arthritis Father   . Cataracts Father   . Glaucoma Father   . Migraines Father   . Headache Father   . Hypothyroidism Mother   . Headache Mother   . Migraines Mother   . Depression Mother   . Anxiety disorder Mother   . Schizophrenia Paternal Uncle   . Autism Other   . Seizures Neg Hx   . Bipolar disorder Neg Hx   . ADD / ADHD Neg Hx     Social History   Tobacco Use  . Smoking status: Current Some Day Smoker    Types: E-cigarettes  . Smokeless tobacco: Never Used  Vaping Use  . Vaping Use: Some days  Substance Use Topics  . Alcohol use: No  . Drug use: Yes    Types: Marijuana    Comment: "once a month"    Home Medications Prior to Admission medications   Medication Sig Start Date End Date Taking? Authorizing Provider  albuterol (VENTOLIN HFA) 108 (90 Base) MCG/ACT inhaler Inhale 1-2 puffs into the lungs every 6 (six) hours as needed for wheezing or shortness of breath.  [provider]  buPROPion (WELLBUTRIN XL) 150 MG 24 hr tablet Take 1 tablet (150 mg total) by mouth every morning. 03/20/20   Sheliah Hatch, MD  ferrous sulfate 325 (65 FE) MG tablet Take 325 mg by mouth daily.    [provider]  fexofenadine (ALLEGRA) 180 MG tablet Take 180 mg by mouth daily.    [provider]  naproxen (NAPROSYN) 375 MG tablet Take 1 tablet (375 mg total) by mouth 2 (two) times daily with a meal. 09/06/20   Pollyann Savoy, MD  norgestimate-ethinyl estradiol (ESTARYLLA) 0.25-35 MG-MCG tablet Take 1 tablet by mouth daily. 03/20/20   Sheliah Hatch, MD  sertraline (ZOLOFT) 100 MG tablet Take 1 tablet (100 mg total) by mouth daily. 03/20/20   Sheliah Hatch, MD     Allergies    Bevelyn Buckles, Peanut-containing drug products, Avocado, Cat hair extract, Dog epithelium, Grass pollen(k-o-r-t-swt vern), Other, and Pollen extract  Review of Systems   Review of Systems  Constitutional: Negative for fever.  Respiratory: Negative for shortness of breath.   Cardiovascular: Negative for chest pain.  Gastrointestinal: Positive for abdominal pain, nausea and vomiting. Negative for constipation and diarrhea.  Genitourinary: Negative for dysuria and vaginal discharge.  All other systems reviewed and are negative.   Physical Exam Updated Vital Signs BP 131/74 (BP Location: Right Arm)   Pulse (!) 113   Temp 98.8 F (37.1 C) (Oral)   Resp 16   Ht 1.651 m (5\' 5" )   Wt 56.7 kg   LMP 08/31/2020 Comment: neg   SpO2 99%   BMI 20.80 kg/m   Physical Exam Vitals and nursing note reviewed.  Constitutional:      Appearance: She is well-developed and well-nourished. She is not ill-appearing.  HENT:     Head: Normocephalic and atraumatic.     Nose: Nose normal.     Mouth/Throat:     Mouth: Mucous membranes are moist.  Eyes:     Pupils: Pupils are equal, round, and reactive to light.  Cardiovascular:     Rate and Rhythm: Normal rate and regular rhythm.     Heart sounds: Normal heart sounds.  Pulmonary:     Effort: Pulmonary effort is normal. No respiratory distress.     Breath sounds: No wheezing.  Abdominal:     General: Bowel sounds are normal.     Palpations: Abdomen is soft.     Tenderness: There is abdominal tenderness. There is guarding and rebound.     Comments: Diffuse suprapubic and lower abdominal tenderness palpation, no rebound or guarding  Musculoskeletal:     Cervical back: Neck supple.  Skin:    General: Skin is warm and dry.  Neurological:     Mental Status: She is alert and oriented to person, place, and time.  Psychiatric:        Mood and Affect: Mood and affect and mood normal.     ED Results / Procedures / Treatments    Labs (all labs ordered are listed, but only abnormal results are displayed) Labs Reviewed  URINE CULTURE  URINALYSIS, ROUTINE W REFLEX MICROSCOPIC    EKG None  Radiology 09/02/2020 PELVIC COMPLETE W TRANSVAGINAL AND TORSION R/O  Result Date: 09/06/2020 CLINICAL DATA:  20 year old female with pelvic and low back pain, intermittent but progressive this week. EXAM: TRANSABDOMINAL AND TRANSVAGINAL ULTRASOUND OF PELVIS DOPPLER ULTRASOUND OF OVARIES TECHNIQUE: Both transabdominal and transvaginal ultrasound examinations of the pelvis were performed. Transabdominal technique was performed for  global imaging of the pelvis including uterus, ovaries, adnexal regions, and pelvic cul-de-sac. It was necessary to proceed with endovaginal exam following the transabdominal exam to visualize the ovaries. Color and duplex Doppler ultrasound was utilized to evaluate blood flow to the ovaries. COMPARISON:  None. FINDINGS: Uterus Measurements: 7.1 x 3.3 x 3.9 cm = volume: 48 mL. No fibroids or other mass visualized. Endometrium Thickness: 3 mm. Endometrium appears normal. Trace fluid suspected in the lower endocervical canal (image 22). Right ovary Measurements: 3.2 x 2.3 x 2.3 cm = volume: 9 mL. Numerous small follicles (image 53). Left ovary Measurements: 2.9 x 1.9 x 2.6 cm = volume: 7 mL. Numerous small follicles (image 75). Pulsed Doppler evaluation of both ovaries demonstrates normal low-resistance arterial and venous waveforms. Other findings Small volume simple appearing free fluid in the cul-de-sac (image 94). IMPRESSION: Negative for ovarian mass or torsion. Physiologic ultrasound appearance of the pelvis. Electronically Signed   By: Odessa Fleming M.D.   On: 09/06/2020 07:31    Procedures Procedures   Medications Ordered in ED Medications  morphine 4 MG/ML injection 4 mg (4 mg Intravenous Given 09/07/20 0524)  ondansetron (ZOFRAN) injection 4 mg (4 mg Intravenous Given 09/07/20 0524)  iohexol (OMNIPAQUE) 300 MG/ML  solution 80 mL (80 mLs Intravenous Contrast Given 09/07/20 0539)    ED Course  I have reviewed the triage vital signs and the nursing notes.  Pertinent labs & imaging results that were available during my care of the patient were reviewed by me and considered in my medical decision making (see chart for details).    MDM Rules/Calculators/A&P                          Patient presents with ongoing lower abdominal pain and back pain.  She is nontoxic-appearing.  Vital signs notable for tachycardia.  She has tenderness on abdominal exam.  She has some chronic component to pain related to her.  Although she is no longer on her menstrual cycle.  Work-up yesterday was largely reassuring.  I repeated urinalysis given findings from yesterday.  Will obtain CT scan to rule out other intra-abdominal pathology such as appendicitis, colitis.  Could be endometriosis or other entity which would require further investigation CT is negative.  Patient signed out with work-up pending.  Final Clinical Impression(s) / ED Diagnoses Final diagnoses:  Lower abdominal pain    Rx / DC Orders ED Discharge Orders    None       Mamie Hundertmark, Mayer Masker, MD 09/07/20 0600

## 2020-09-08 ENCOUNTER — Emergency Department (HOSPITAL_BASED_OUTPATIENT_CLINIC_OR_DEPARTMENT_OTHER)
Admission: EM | Admit: 2020-09-08 | Discharge: 2020-09-08 | Disposition: A | Discharge status: Home / Self Care | Payer: BC Managed Care – PPO | Attending: Emergency Medicine | Admitting: Emergency Medicine

## 2020-09-08 ENCOUNTER — Other Ambulatory Visit: Payer: Self-pay

## 2020-09-08 ENCOUNTER — Telehealth: Payer: Self-pay | Admitting: Family Medicine

## 2020-09-08 ENCOUNTER — Encounter (HOSPITAL_BASED_OUTPATIENT_CLINIC_OR_DEPARTMENT_OTHER): Payer: Self-pay | Admitting: Emergency Medicine

## 2020-09-08 DIAGNOSIS — F17290 Nicotine dependence, other tobacco product, uncomplicated: Secondary | ICD-10-CM | POA: Insufficient documentation

## 2020-09-08 DIAGNOSIS — A549 Gonococcal infection, unspecified: Secondary | ICD-10-CM

## 2020-09-08 DIAGNOSIS — J45909 Unspecified asthma, uncomplicated: Secondary | ICD-10-CM | POA: Insufficient documentation

## 2020-09-08 DIAGNOSIS — Z91010 Allergy to peanuts: Secondary | ICD-10-CM | POA: Diagnosis not present

## 2020-09-08 DIAGNOSIS — R109 Unspecified abdominal pain: Secondary | ICD-10-CM | POA: Diagnosis not present

## 2020-09-08 LAB — URINE CULTURE: Culture: <10000 — AB

## 2020-09-08 MED ORDER — LIDOCAINE HCL (PF) 1 % IJ SOLN
5.0000 mL | Freq: Once | INTRAMUSCULAR | Status: AC
  Administered 2020-09-08: 1 mL
  Filled 2020-09-08: qty 5

## 2020-09-08 MED ORDER — CEFTRIAXONE SODIUM 500 MG IJ SOLR
500.0000 mg | Freq: Once | INTRAMUSCULAR | Status: AC
  Administered 2020-09-08: 500 mg via INTRAMUSCULAR
  Filled 2020-09-08: qty 500

## 2020-09-08 NOTE — Telephone Encounter (Signed)
Pt called in stating that she was seen in the ED on 09/06/20 and 09/07/20 for abd pain. She states that she seen her results from the labs she had done and it shows that she was positive for GC/Chlamydia. She wanted to know if Dr.Tabori would send in a prescription for her or if she needs to be seen in the office. Dr. Beverely Low only has same day appts on Thursday and Friday.  Please advise on how to move forward with this.

## 2020-09-08 NOTE — Telephone Encounter (Signed)
Patient returned call and stated that she is going to get the injection from the hospital today because she has been out of work for 2 days and cannot do anything on her own and needs to return to work tomorrow. She also stated that she will update [place] on how she is doing after the injection.

## 2020-09-08 NOTE — Telephone Encounter (Signed)
Called patient and left a vm to return call to office to schedule a nurse visit for a Ceftriaxone Injection.

## 2020-09-08 NOTE — ED Triage Notes (Signed)
Seen this weekend for abdominal pain and had pelvic exam.  Received call today that she was positive for std and needs treatment.

## 2020-09-08 NOTE — Discharge Instructions (Signed)
No sexual intercourse for 10 days post treatment.  If still having symptoms at that time would follow-up with primary care provider for repeat testing.  May take Tylenol and ibuprofen as needed for pain.  Drink fluids and rest  Return for any worsening symptoms.

## 2020-09-08 NOTE — Telephone Encounter (Signed)
We need pt to schedule a nurse visit for a Ceftriaxone injection of 500mg  IM x1 dose.  We will also need to complete the reportable disease form and send it to the county.  Her partner(s) will also need to be treated.

## 2020-09-08 NOTE — Telephone Encounter (Signed)
Patient would like to know if she needs to follow up with you for tx. Please advise

## 2020-09-08 NOTE — ED Provider Notes (Addendum)
[facility] EMERGENCY DEPARTMENT Provider Note   CSN: [id number] Arrival date & time: 09/08/20  1555    History Chief Complaint  Patient presents with  . Follow-up    Annette Hart is a 20 y.o. female with medical history significant for MDD, dysmenorrhea who presents for evaluation of positive STD screen.  Was seen here 2 days ago for abdominal pain.  Had ultrasound and CT scan which did not show any significant findings.  Had pelvic exam with swabs performed which did show positive for gonorrhea.  States her pain has improved some however is not completely gone away.  She has been taking MiraLAX for her constipation has been having bowel movements without difficulty.  No fever, chills, nausea, vomiting, headache, lightheadedness, dizziness.  Denies additional aggravating or alleviating factors.  History of obtained from patient and past medical records.  No interpreter was used.  HPI     Past Medical History:  Diagnosis Date  . Asthma   . Seasonal allergies     Patient Active Problem List   Diagnosis Date Noted  . Physical exam 06/23/2020  . Borderline personality disorder (HCC) 03/20/2020  . Dysmenorrhea in adolescent 11/10/2016  . Anxiety and depression 11/10/2016  . GERD (gastroesophageal reflux disease) 11/10/2016  . Migraine without aura and without status migrainosus, not intractable 08/12/2016  . Allergic reaction 11/07/2013    Past Surgical History:  Procedure Laterality Date  . UMBILICAL HERNIA REPAIR  2003     OB History   No obstetric history on file.     Family History  Problem Relation Age of Onset  . Hypertension Maternal Grandmother   . Diabetes Maternal Grandfather   . Arthritis Father   . Cataracts Father   . Glaucoma Father   . Migraines Father   . Headache Father   . Hypothyroidism Mother   . Headache Mother   . Migraines Mother   . Depression Mother   . Anxiety disorder Mother   . Schizophrenia Paternal Uncle   . Autism  Other   . Seizures Neg Hx   . Bipolar disorder Neg Hx   . ADD / ADHD Neg Hx     Social History   Tobacco Use  . Smoking status: Current Some Day Smoker    Types: E-cigarettes  . Smokeless tobacco: Never Used  Vaping Use  . Vaping Use: Some days  Substance Use Topics  . Alcohol use: No  . Drug use: Yes    Types: Marijuana    Comment: "once a month"    Home Medications Prior to Admission medications   Medication Sig Start Date End Date Taking? Authorizing Provider  albuterol (VENTOLIN HFA) 108 (90 Base) MCG/ACT inhaler Inhale 1-2 puffs into the lungs every 6 (six) hours as needed for wheezing or shortness of breath.    [provider]  buPROPion (WELLBUTRIN XL) 150 MG 24 hr tablet Take 1 tablet (150 mg total) by mouth every morning. 03/20/20   Sheliah Hatch, MD  ferrous sulfate 325 (65 FE) MG tablet Take 325 mg by mouth daily.    [provider]  fexofenadine (ALLEGRA) 180 MG tablet Take 180 mg by mouth daily.    [provider]  naproxen (NAPROSYN) 375 MG tablet Take 1 tablet (375 mg total) by mouth 2 (two) times daily with a meal. 09/06/20   Pollyann Savoy, MD  norgestimate-ethinyl estradiol (ESTARYLLA) 0.25-35 MG-MCG tablet Take 1 tablet by mouth daily. 03/20/20   Sheliah Hatch, MD  sertraline (ZOLOFT) 100 MG tablet Take 1 tablet (100 mg total) by mouth daily. 03/20/20   Sheliah Hatch, MD    Allergies    Bevelyn Buckles, Peanut-containing drug products, Avocado, Cat hair extract, Dog epithelium, Grass pollen(k-o-r-t-swt vern), Other, and Pollen extract  Review of Systems   Review of Systems  Constitutional: Negative.   HENT: Negative.   Respiratory: Negative.   Cardiovascular: Negative.   Gastrointestinal: Positive for abdominal pain (Lower abdomen, improved).  Genitourinary: Positive for vaginal discharge. Negative for decreased urine volume, flank pain, frequency, hematuria, menstrual problem, pelvic pain, vaginal bleeding  and vaginal pain.  Musculoskeletal: Negative.   Skin: Negative.   Neurological: Negative.   All other systems reviewed and are negative.   Physical Exam Updated Vital Signs BP 110/70 (BP Location: Right Arm)   Pulse (!) 107   Temp 98.7 F (37.1 C) (Oral)   Resp 18   Ht 5\' 5"  (1.651 m)   Wt 56.7 kg   LMP 08/31/2020 Comment: neg   SpO2 100%   BMI 20.80 kg/m   Physical Exam Vitals and nursing note reviewed.  Constitutional:      General: She is not in acute distress.    Appearance: She is well-developed and well-nourished. She is not ill-appearing, toxic-appearing or diaphoretic.  HENT:     Head: Normocephalic and atraumatic.     Nose: Nose normal.     Mouth/Throat:     Mouth: Mucous membranes are moist.  Eyes:     Pupils: Pupils are equal, round, and reactive to light.  Cardiovascular:     Rate and Rhythm: Normal rate.     Pulses: Intact distal pulses.  Pulmonary:     Effort: No respiratory distress.  Abdominal:     General: Bowel sounds are normal. There is no distension.     Tenderness: There is no abdominal tenderness. There is no right CVA tenderness, left CVA tenderness, guarding or rebound.  Musculoskeletal:        General: Normal range of motion.     Cervical back: Normal range of motion.  Skin:    General: Skin is warm and dry.     Capillary Refill: Capillary refill takes less than 2 seconds.  Neurological:     General: No focal deficit present.     Mental Status: She is alert and oriented to person, place, and time.  Psychiatric:        Mood and Affect: Mood and affect normal.     ED Results / Procedures / Treatments   Labs (all labs ordered are listed, but only abnormal results are displayed) Labs Reviewed - No data to display  EKG None  Radiology CT ABDOMEN PELVIS W CONTRAST  Result Date: 09/07/2020 CLINICAL DATA:  20 year old female with abdominal pain, back pain. Progressive pain since yesterday now with vomiting. EXAM: CT ABDOMEN AND  PELVIS WITH CONTRAST TECHNIQUE: Multidetector CT imaging of the abdomen and pelvis was performed using the standard protocol following bolus administration of intravenous contrast. CONTRAST:  16mL OMNIPAQUE IOHEXOL 300 MG/ML  SOLN COMPARISON:  Pelvis ultrasound yesterday. FINDINGS: Lower chest: Negative. Hepatobiliary: Negative liver and gallbladder. No bile duct enlargement. Pancreas: Negative. Spleen: Negative. Adrenals/Urinary Tract: Normal adrenal glands. Kidneys enhance symmetrically and appear normal. No nephrolithiasis or perinephric stranding. Renal collecting systems and proximal ureters appear within normal limits. Negative bladder. No urinary calculus identified. Stomach/Bowel: Mild retained stool in the rectosigmoid colon. Highly redundant sigmoid. Gas and stool in the descending colon and at the splenic  flexure. Mildly redundant transverse. More liquid stool in the right colon. Normal appendix identified (coronal image 29). No large bowel inflammation. Terminal ileum difficult to delineate, and there are some fluid-filled small bowel loops in the pelvis, but there is no dilated small bowel. Stomach and duodenum appear negative. No free air. No free fluid or mesenteric stranding. Vascular/Lymphatic: Major arterial structures are patent and normal. Portal venous system is patent. No lymphadenopathy. Reproductive: Within normal limits. Other: No pelvic free fluid. Musculoskeletal: Negative; left inferior pubic ramus likely nearing skeletal maturity on series 2, image 78. IMPRESSION: Normal appendix. No acute or inflammatory process identified in the abdomen or pelvis. IT downtime report was issued on this study at 0601 hours on 09/07/2020. Electronically Signed   By: Odessa FlemingH  Hall M.D.   On: 09/07/2020 06:43   Procedures Procedures   Medications Ordered in ED Medications  cefTRIAXone (ROCEPHIN) injection 500 mg (has no administration in time range)  lidocaine (PF) (XYLOCAINE) 1 % injection 5 mL (has no  administration in time range)    ED Course  I have reviewed the triage vital signs and the nursing notes.  Pertinent labs & imaging results that were available during my care of the patient were reviewed by me and considered in my medical decision making (see chart for details).  Patient here for medication for positive gonorrhea screen.  Was seen here yesterday for abdominal pain.  Had CT scan and ultrasound which not showing significant abnormality.  Had pelvic exam performed due to some abnormal vaginal discharge which was positive for STD.  Symptoms have mildly improved. Her heart and lungs are clear.  Her abdomen is soft.  Drinking p.o., initial evaluation.  Will treat for gonorrhea with IM Rocephin.  Reviewed prior notes, and prior GU exam patient did not have any CMT, low suspicion for PID. She has PCP follow-up.  Is requesting a work note due to being seen here in the ED yesterday.  Will provide.  No  have a surgical abdomin and there are no peritoneal signs.  No indication of appendicitis, bowel obstruction, bowel perforation, cholecystitis, diverticulitis, PID or ectopic pregnancy.    The patient has been appropriately medically screened and/or stabilized in the ED. I have low suspicion for any other emergent medical condition which would require further screening, evaluation or treatment in the ED or require inpatient management.  Patient is hemodynamically stable and in no acute distress.  Patient able to ambulate in department prior to ED.  Evaluation does not show acute pathology that would require ongoing or additional emergent interventions while in the emergency department or further inpatient treatment.  I have discussed the diagnosis with the patient and answered all questions.  Pain is been managed while in the emergency department and patient has no further complaints prior to discharge.  Patient is comfortable with plan discussed in room and is stable for discharge at this time.   I have discussed strict return precautions for returning to the emergency department.  Patient was encouraged to follow-up with PCP/specialist refer to at discharge.    MDM Rules/Calculators/A&P                           Final Clinical Impression(s) / ED Diagnoses Final diagnoses:  Gonorrhea    Rx / DC Orders ED Discharge Orders    None       Anaya Bovee A, PA-C 09/08/20 1657    Jeilyn Reznik A, PA-C 09/08/20 1657  Maia Plan, MD 09/17/20 1001

## 2020-09-15 ENCOUNTER — Other Ambulatory Visit: Payer: Self-pay

## 2020-09-15 ENCOUNTER — Encounter: Payer: Self-pay | Admitting: Family Medicine

## 2020-09-15 ENCOUNTER — Ambulatory Visit (INDEPENDENT_AMBULATORY_CARE_PROVIDER_SITE_OTHER): Payer: BC Managed Care – PPO | Admitting: Family Medicine

## 2020-09-15 DIAGNOSIS — M545 Low back pain, unspecified: Secondary | ICD-10-CM

## 2020-09-15 DIAGNOSIS — G8929 Other chronic pain: Secondary | ICD-10-CM | POA: Diagnosis not present

## 2020-09-15 MED ORDER — BACLOFEN 10 MG PO TABS: 5.0000 mg | ORAL_TABLET | Freq: Three times a day (TID) | ORAL | 3 refills | Status: DC | PRN

## 2020-09-15 NOTE — Progress Notes (Signed)
Office Visit Note   Patient: Annette Hart           Date of Birth: 2001/03/27           MRN: [id number] Visit Date: 09/15/2020 Requested by: Sheliah Hatch, MD [address] [place],  [place] [address] PCP: Sheliah Hatch, MD  Subjective: Chief Complaint  Patient presents with  . Lower Back - Pain    Pain in lower back and occasionally down the legs. Started 3 years ago while on her period. 1 week of the month she could not walk without aid, nor turn over in bed without assistance. Has seen a GYN about this - she says no cause was found. The pain now comes at other times, besides just when on her period. "It usually is an everyday thing."    HPI: She is here with low back pain.  Symptoms started about 3 years ago, no injury.  Initially she experienced midline lumbar pain which wrapped around both sides of her back toward the abdomen, primarily during her menstrual cycle.  In the past year her pain has become more constant and is even when she is not having menstrual periods.  She tried chiropractic a couple years ago and it gave her temporary relief but never made the pain go away completely.  She is taking over-the-counter anti-inflammatories now and that helps a little bit.  Her gynecologist did some imaging studies which did not show endometriosis.  She had x-rays 2 years ago which I viewed on computer showing normal bony anatomy.  She occasionally gets pain down the legs, no numbness or weakness.  Currently she is working Clinical biochemist doing desk work, which is better for her pain than standing.  Family history of arthritis in her grandmother but not necessarily at a young age.                ROS:   All other systems were reviewed and are negative.  Objective: Vital Signs: LMP 08/31/2020 Comment: neg   Physical Exam:  General:  Alert and oriented, in no acute distress. Pulm:  Breathing unlabored. Psy:  Normal mood, congruent affect. Skin: No rash Low back:  She has no scoliosis.  Slight leg length discrepancy with left leg longer than the right.  Tender in the L5-S1 area midline, and in the paraspinous muscles bilaterally.  No pain in the sciatic notch, no pain with internal hip rotation.  Lower extremity strength and reflexes are normal.    Imaging: No results found.  Assessment & Plan: 1.  Chronic low back pain, etiology uncertain.  Could be myofascial pain, cannot rule out inflammatory arthritis or even endometriosis. -We will try physical therapy.  If this does not help, then MRI scan.  If MRI scan is unremarkable, then labs.     Procedures: No procedures performed        PMFS History: Patient Active Problem List   Diagnosis Date Noted  . Physical exam 06/23/2020  . Borderline personality disorder (HCC) 03/20/2020  . Dysmenorrhea in adolescent 11/10/2016  . Anxiety and depression 11/10/2016  . GERD (gastroesophageal reflux disease) 11/10/2016  . Migraine without aura and without status migrainosus, not intractable 08/12/2016  . Allergic reaction 11/07/2013   Past Medical History:  Diagnosis Date  . Asthma   . Seasonal allergies     Family History  Problem Relation Age of Onset  . Hypertension Maternal Grandmother   . Diabetes Maternal Grandfather   . Arthritis  Father   . Cataracts Father   . Glaucoma Father   . Migraines Father   . Headache Father   . Hypothyroidism Mother   . Headache Mother   . Migraines Mother   . Depression Mother   . Anxiety disorder Mother   . Schizophrenia Paternal Uncle   . Autism Other   . Seizures Neg Hx   . Bipolar disorder Neg Hx   . ADD / ADHD Neg Hx     Past Surgical History:  Procedure Laterality Date  . UMBILICAL HERNIA REPAIR  2003   Social History   Occupational History  . Not on file  Tobacco Use  . Smoking status: Current Some Day Smoker    Types: E-cigarettes  . Smokeless tobacco: Never Used  Vaping Use  . Vaping Use: Some days  Substance and Sexual Activity   . Alcohol use: No  . Drug use: Yes    Types: Marijuana    Comment: "once a month"  . Sexual activity: Never

## 2020-09-24 ENCOUNTER — Ambulatory Visit: Payer: Self-pay | Admitting: Physician Assistant

## 2020-10-13 IMAGING — DX LUMBAR SPINE - 2-3 VIEW
3 series · 3 of 3 positions shown · non-contrast
Comparison: None.

CLINICAL DATA: Lumbar pain after fall, pain mid to lower back

EXAM:
LUMBAR SPINE - 2-3 VIEW

[l-spine ap]
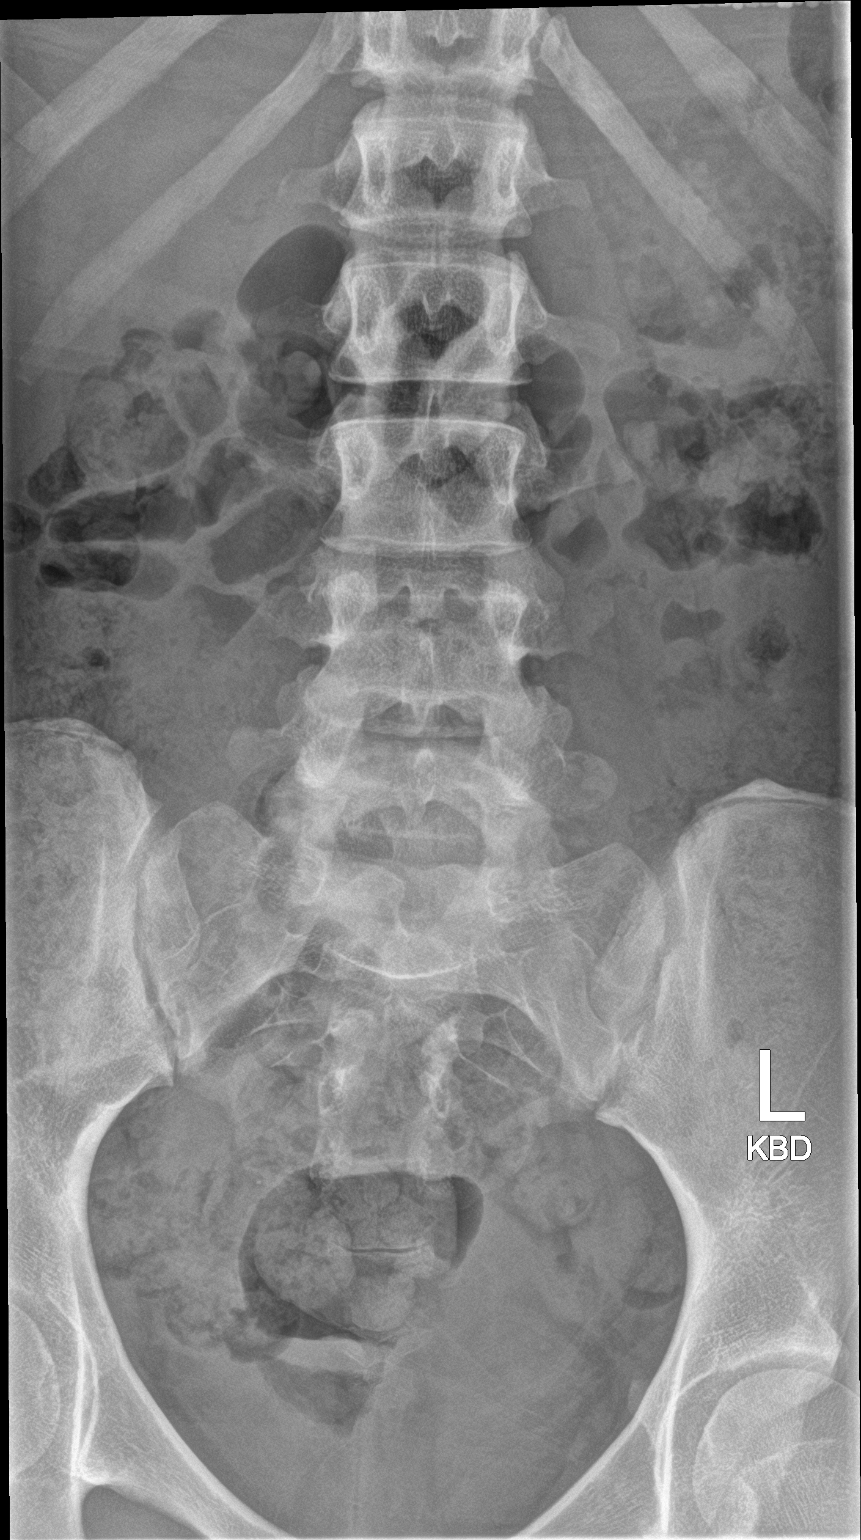

[l-spine lat]
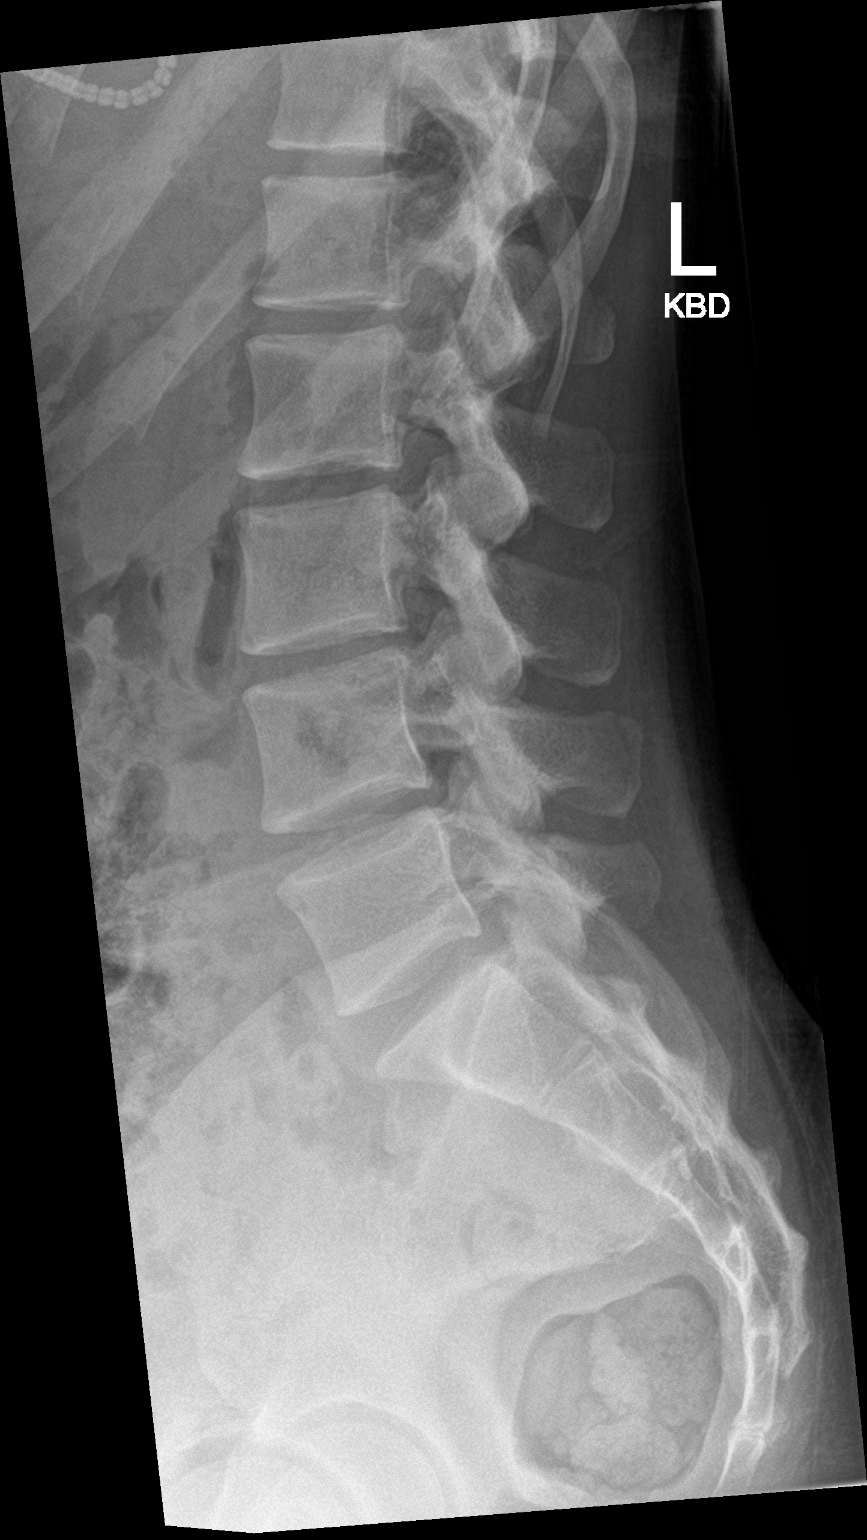

[l-spine spot]
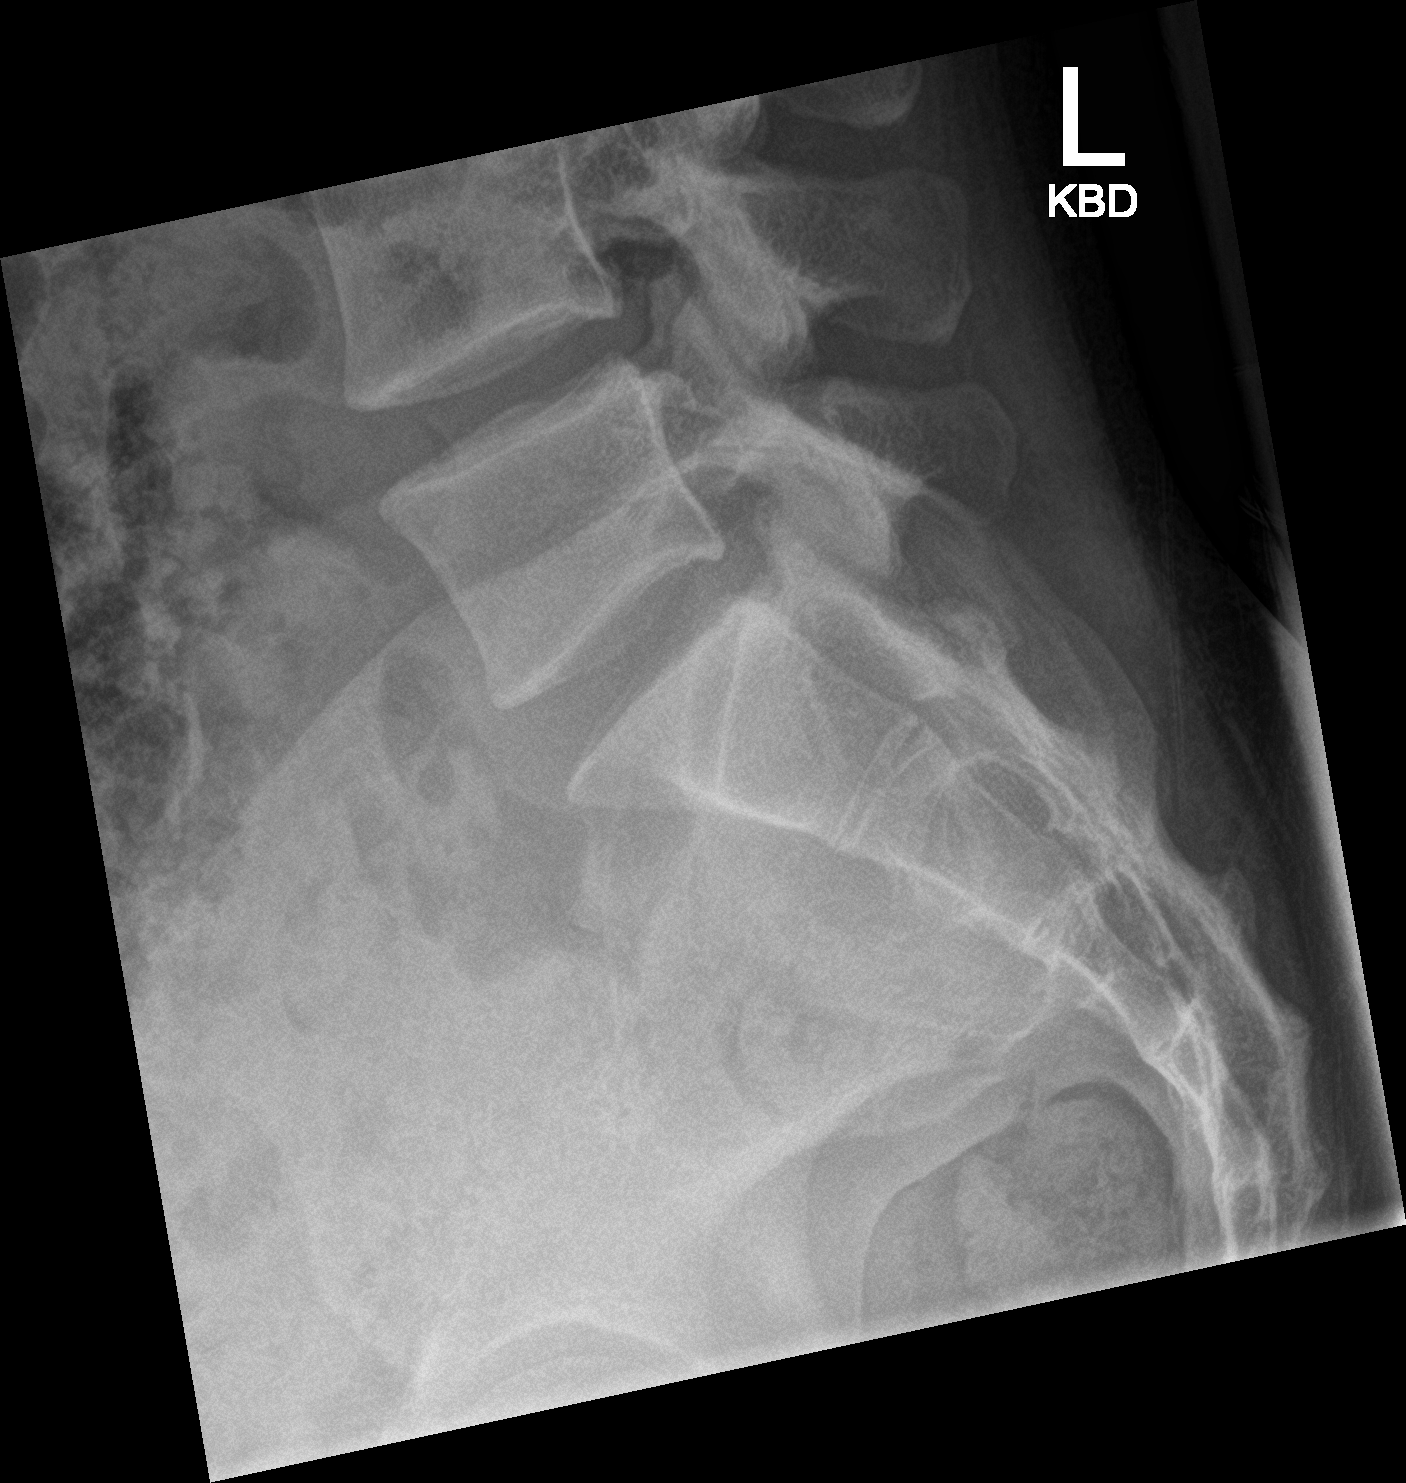

[3 of 3 positions shown; findings below may reference images not displayed]

FINDINGS: No acute fracture or vertebral body height loss. No traumatic
listhesis. Five lumbar type vertebral bodies are noted. Alignment is
normal. Intervertebral disc spaces are maintained. Included portions
of the pelvis are unremarkable. The bowel gas pattern is normal.
IMPRESSION: No acute fracture or traumatic listhesis. Please note: Spine
radiography has limited sensitivity and specificity in the setting
of trauma. If there is significant mechanism and persisting clinical
concern, recommend low threshold for CT imaging.

## 2020-10-15 ENCOUNTER — Other Ambulatory Visit: Payer: Self-pay

## 2020-10-15 ENCOUNTER — Encounter (HOSPITAL_COMMUNITY): Payer: Self-pay

## 2020-10-15 ENCOUNTER — Emergency Department (HOSPITAL_COMMUNITY)
Admission: EM | Admit: 2020-10-15 | Discharge: 2020-10-15 | Disposition: A | Discharge status: Home / Self Care | Payer: BC Managed Care – PPO | Attending: Emergency Medicine | Admitting: Emergency Medicine

## 2020-10-15 DIAGNOSIS — F17290 Nicotine dependence, other tobacco product, uncomplicated: Secondary | ICD-10-CM | POA: Diagnosis not present

## 2020-10-15 DIAGNOSIS — Z91010 Allergy to peanuts: Secondary | ICD-10-CM | POA: Insufficient documentation

## 2020-10-15 DIAGNOSIS — J029 Acute pharyngitis, unspecified: Secondary | ICD-10-CM | POA: Diagnosis not present

## 2020-10-15 DIAGNOSIS — J45909 Unspecified asthma, uncomplicated: Secondary | ICD-10-CM | POA: Diagnosis not present

## 2020-10-15 LAB — GROUP A STREP BY PCR: Group A Strep by PCR: NOT DETECTED

## 2020-10-15 NOTE — Discharge Instructions (Addendum)
Your strep test today was negative, you may try to symptomatically treat your sore throat while at home.  If you experience any worsening symptoms please return to the emergency department.

## 2020-10-15 NOTE — ED Triage Notes (Signed)
Pt to ED by POV from home with c/o sore throat which began yesterday. Pt also endorses fatigue. No redness or swelling noted to the area. Arrives A+O, VSS, NADN.

## 2020-10-15 NOTE — ED Provider Notes (Signed)
[facility]-EMERGENCY DEPT Provider Note   CSN: [id number] Arrival date & time: 10/15/20  2021     History Chief Complaint  Patient presents with  . Sore Throat    Annette Hart is a 20 y.o. female.  20 y.o female with a past medical history of recurrent strep pharyngitis presents to the ED today with a chief complaint of sore throat since yesterday.  Reports she feels a sharp, needle sensation on her throat, now feels like she had a shot of lidocaine as there is numbness to it due to the pain.  States she was at work today, felt like she was too sick to continue working therefore she went home.  States she has had increased amount of sleep throughout the day.  Reports she has taken some Tylenol along with ibuprofen without any improvement in her symptoms.  It is exacerbated with drinking, saliva.  Any changes to her speech, dental pain, fever, dysphagia.  The history is provided by the patient.       Past Medical History:  Diagnosis Date  . Asthma   . Seasonal allergies     Patient Active Problem List   Diagnosis Date Noted  . Physical exam 06/23/2020  . Borderline personality disorder (HCC) 03/20/2020  . Dysmenorrhea in adolescent 11/10/2016  . Anxiety and depression 11/10/2016  . GERD (gastroesophageal reflux disease) 11/10/2016  . Migraine without aura and without status migrainosus, not intractable 08/12/2016  . Allergic reaction 11/07/2013    Past Surgical History:  Procedure Laterality Date  . UMBILICAL HERNIA REPAIR  2003     OB History   No obstetric history on file.     Family History  Problem Relation Age of Onset  . Hypertension Maternal Grandmother   . Diabetes Maternal Grandfather   . Arthritis Father   . Cataracts Father   . Glaucoma Father   . Migraines Father   . Headache Father   . Hypothyroidism Mother   . Headache Mother   . Migraines Mother   . Depression Mother   . Anxiety disorder Mother   . Schizophrenia  Paternal Uncle   . Autism Other   . Seizures Neg Hx   . Bipolar disorder Neg Hx   . ADD / ADHD Neg Hx     Social History   Tobacco Use  . Smoking status: Current Some Day Smoker    Types: E-cigarettes  . Smokeless tobacco: Never Used  Vaping Use  . Vaping Use: Some days  Substance Use Topics  . Alcohol use: No  . Drug use: Yes    Types: Marijuana    Comment: "once a month"    Home Medications Prior to Admission medications   Medication Sig Start Date End Date Taking? Authorizing Provider  albuterol (VENTOLIN HFA) 108 (90 Base) MCG/ACT inhaler Inhale 1-2 puffs into the lungs every 6 (six) hours as needed for wheezing or shortness of breath.    [provider]  baclofen (LIORESAL) 10 MG tablet Take 0.5-1 tablets (5-10 mg total) by mouth 3 (three) times daily as needed for muscle spasms. 09/15/20   Hilts, Casimiro Needle, MD  buPROPion (WELLBUTRIN XL) 150 MG 24 hr tablet Take 1 tablet (150 mg total) by mouth every morning. 03/20/20   Sheliah Hatch, MD  ferrous sulfate 325 (65 FE) MG tablet Take 325 mg by mouth daily.    [provider]  fexofenadine (ALLEGRA) 180 MG tablet Take 180 mg by mouth daily.    [provider]  naproxen (NAPROSYN) 375 MG tablet Take 1 tablet (375 mg total) by mouth 2 (two) times daily with a meal. Patient not taking: Reported on 09/15/2020 09/06/20   Pollyann Savoy, MD  norgestimate-ethinyl estradiol (ESTARYLLA) 0.25-35 MG-MCG tablet Take 1 tablet by mouth daily. 03/20/20   Sheliah Hatch, MD  sertraline (ZOLOFT) 100 MG tablet Take 1 tablet (100 mg total) by mouth daily. 03/20/20   Sheliah Hatch, MD    Allergies    Bevelyn Buckles, Peanut-containing drug products, Avocado, Cat hair extract, Dog epithelium, Grass pollen(k-o-r-t-swt vern), Other, and Pollen extract  Review of Systems   Review of Systems  Constitutional: Negative for fever.  HENT: Positive for sore throat. Negative for rhinorrhea and sinus pain.    Respiratory: Positive for cough. Negative for shortness of breath.   Cardiovascular: Negative for chest pain.  Skin: Negative for wound.    Physical Exam Updated Vital Signs BP 114/76 (BP Location: Right Arm)   Pulse 90   Temp 98.9 F (37.2 C)   Resp 16   Ht 5\' 5"  (1.651 m)   Wt 59 kg   LMP 10/13/2020 (Exact Date)   SpO2 99%   BMI 21.63 kg/m   Physical Exam Vitals and nursing note reviewed.  Constitutional:      Appearance: She is well-developed.  HENT:     Head: Normocephalic and atraumatic.     Mouth/Throat:     Mouth: Mucous membranes are moist.     Pharynx: Uvula midline. Oropharyngeal exudate present. No pharyngeal swelling, posterior oropharyngeal erythema or uvula swelling.     Tonsils: Tonsillar exudate present. No tonsillar abscesses. 1+ on the right. 1+ on the left.     Comments: Uvula is midline, bilateral tonsillar exudates 1+.  No PTA noted Cardiovascular:     Rate and Rhythm: Normal rate.  Pulmonary:     Effort: Pulmonary effort is normal.  Abdominal:     Palpations: Abdomen is soft.  Musculoskeletal:     Cervical back: Normal range of motion and neck supple.  Skin:    General: Skin is warm and dry.  Neurological:     Mental Status: She is alert and oriented to person, place, and time.     ED Results / Procedures / Treatments   Labs (all labs ordered are listed, but only abnormal results are displayed) Labs Reviewed  GROUP A STREP BY PCR    EKG None  Radiology No results found.  Procedures Procedures   Medications Ordered in ED Medications - No data to display  ED Course  I have reviewed the triage vital signs and the nursing notes.  Pertinent labs & imaging results that were available during my care of the patient were reviewed by me and considered in my medical decision making (see chart for details).    MDM Rules/Calculators/A&P  Patient with a past medical history of recurrent pharyngitis presents to the ED with a chief  complaint of current sore throat, the symptoms began yesterday, they have worsened in nature.  She has taken some over-the-counter medication without improvement in her symptoms.  Denies any fever, dental pain, headaches, shortness of breath or other URI symptoms.  Reports she has had a cough as she notes sharp pain to the back of her throat.  Arrived in the ED afebrile, vitals are within normal limits.  Oropharynx appears erythematous with bilateral tonsillar exudates, 1+.  No PTA noted, uvula is midline.  No cervical lymphadenopathy.  Will obtain swab at  this time for test for strep pharyngitis.  10:09 PMLab called for testing, reports test is currently spinning.  Strep test is negative on today's visit.  We discussed symptomatic treatment at home, she is to return if symptoms worsen.  Patient agreeable, return precautions discussed at length.  Patient stable for discharge.  Portions of this note were generated with Scientist, clinical (histocompatibility and immunogenetics). Dictation errors may occur despite best attempts at proofreading.  Final Clinical Impression(s) / ED Diagnoses Final diagnoses:  Sore throat    Rx / DC Orders ED Discharge Orders    None       Claude Manges, PA-C 10/15/20 2216    Rolan Bucco, MD 10/15/20 2219

## 2020-10-21 ENCOUNTER — Encounter (HOSPITAL_BASED_OUTPATIENT_CLINIC_OR_DEPARTMENT_OTHER): Payer: Self-pay | Admitting: *Deleted

## 2020-10-21 ENCOUNTER — Emergency Department (HOSPITAL_BASED_OUTPATIENT_CLINIC_OR_DEPARTMENT_OTHER)
Admission: EM | Admit: 2020-10-21 | Discharge: 2020-10-21 | Disposition: A | Discharge status: Home / Self Care | Payer: BC Managed Care – PPO | Attending: Emergency Medicine | Admitting: Emergency Medicine

## 2020-10-21 ENCOUNTER — Other Ambulatory Visit: Payer: Self-pay

## 2020-10-21 ENCOUNTER — Ambulatory Visit: Payer: BC Managed Care – PPO | Admitting: Family Medicine

## 2020-10-21 DIAGNOSIS — Z91010 Allergy to peanuts: Secondary | ICD-10-CM | POA: Diagnosis not present

## 2020-10-21 DIAGNOSIS — J029 Acute pharyngitis, unspecified: Secondary | ICD-10-CM | POA: Diagnosis not present

## 2020-10-21 DIAGNOSIS — J45909 Unspecified asthma, uncomplicated: Secondary | ICD-10-CM | POA: Diagnosis not present

## 2020-10-21 DIAGNOSIS — F17290 Nicotine dependence, other tobacco product, uncomplicated: Secondary | ICD-10-CM | POA: Insufficient documentation

## 2020-10-21 LAB — GROUP A STREP BY PCR: Group A Strep by PCR: NOT DETECTED

## 2020-10-21 MED ORDER — LIDOCAINE VISCOUS HCL 2 % MT SOLN: 15.0000 mL | OROMUCOSAL | 0 refills | Status: AC | PRN

## 2020-10-21 MED ORDER — LIDOCAINE VISCOUS HCL 2 % MT SOLN
15.0000 mL | Freq: Once | OROMUCOSAL | Status: AC
  Administered 2020-10-21: 15 mL via ORAL
  Filled 2020-10-21: qty 15

## 2020-10-21 MED ORDER — DEXAMETHASONE SODIUM PHOSPHATE 10 MG/ML IJ SOLN
10.0000 mg | Freq: Once | INTRAMUSCULAR | Status: AC
  Administered 2020-10-21: 10 mg via INTRAMUSCULAR
  Filled 2020-10-21: qty 1

## 2020-10-21 MED ORDER — ALUM & MAG HYDROXIDE-SIMETH 200-200-20 MG/5ML PO SUSP
30.0000 mL | Freq: Once | ORAL | Status: AC
  Administered 2020-10-21: 30 mL via ORAL
  Filled 2020-10-21: qty 30

## 2020-10-21 NOTE — Discharge Instructions (Addendum)
You were given steroids while in the ED today.  I have also given a prescription for some viscous lidocaine to help with your sore throat, please use this as prescribed.  We schedule an appointment with ENT for further management of your recurrent strep pharyngitis.

## 2020-10-21 NOTE — ED Triage Notes (Signed)
Cont c/o sore throat  X " weeks"

## 2020-10-21 NOTE — ED Provider Notes (Signed)
[facility] EMERGENCY DEPARTMENT Provider Note   CSN: [id number] Arrival date & time: 10/21/20  2113     History Chief Complaint  Patient presents with  . Sore Throat    Annette Hart is a 20 y.o. female.  43 y,o female with a PMH of Asthma presents to the ED with a chief complaint of sore throat times a few weeks.  Patient describes a sharp, burning sensation to the back of her throat worse on the left side than the right side.  Has a prior history of recurrent strep pharyngitis, was evaluated in the emergency department about a week ago and had a negative strep pharyngitis test.  She now reports the pain is expanding onto the left ear, left neck.  Does report there is pain with swallowing, but she is able to tolerate her secretions.  She denies any chest pain, shortness of breath, fever, trismus.  The history is provided by the patient.       Past Medical History:  Diagnosis Date  . Asthma   . Seasonal allergies     Patient Active Problem List   Diagnosis Date Noted  . Physical exam 06/23/2020  . Borderline personality disorder (HCC) 03/20/2020  . Dysmenorrhea in adolescent 11/10/2016  . Anxiety and depression 11/10/2016  . GERD (gastroesophageal reflux disease) 11/10/2016  . Migraine without aura and without status migrainosus, not intractable 08/12/2016  . Allergic reaction 11/07/2013    Past Surgical History:  Procedure Laterality Date  . UMBILICAL HERNIA REPAIR  2003     OB History   No obstetric history on file.     Family History  Problem Relation Age of Onset  . Hypertension Maternal Grandmother   . Diabetes Maternal Grandfather   . Arthritis Father   . Cataracts Father   . Glaucoma Father   . Migraines Father   . Headache Father   . Hypothyroidism Mother   . Headache Mother   . Migraines Mother   . Depression Mother   . Anxiety disorder Mother   . Schizophrenia Paternal Uncle   . Autism Other   . Seizures Neg Hx   . Bipolar  disorder Neg Hx   . ADD / ADHD Neg Hx     Social History   Tobacco Use  . Smoking status: Current Some Day Smoker    Types: E-cigarettes  . Smokeless tobacco: Never Used  Vaping Use  . Vaping Use: Some days  Substance Use Topics  . Alcohol use: No  . Drug use: Yes    Types: Marijuana    Comment: "once a month"    Home Medications Prior to Admission medications   Medication Sig Start Date End Date Taking? Authorizing Provider  lidocaine (XYLOCAINE) 2 % solution Use as directed 15 mLs in the mouth or throat as needed for up to 5 days for mouth pain. 10/21/20 10/26/20 Yes Brogan England, PA-C  albuterol (VENTOLIN HFA) 108 (90 Base) MCG/ACT inhaler Inhale 1-2 puffs into the lungs every 6 (six) hours as needed for wheezing or shortness of breath.    [provider]  baclofen (LIORESAL) 10 MG tablet Take 0.5-1 tablets (5-10 mg total) by mouth 3 (three) times daily as needed for muscle spasms. 09/15/20   Hilts, Casimiro Needle, MD  buPROPion (WELLBUTRIN XL) 150 MG 24 hr tablet Take 1 tablet (150 mg total) by mouth every morning. 03/20/20   Sheliah Hatch, MD  ferrous sulfate 325 (65 FE) MG tablet Take 325 mg by mouth daily.  [provider]  fexofenadine (ALLEGRA) 180 MG tablet Take 180 mg by mouth daily.    [provider]  naproxen (NAPROSYN) 375 MG tablet Take 1 tablet (375 mg total) by mouth 2 (two) times daily with a meal. Patient not taking: Reported on 09/15/2020 09/06/20   Pollyann Savoy, MD  norgestimate-ethinyl estradiol (ESTARYLLA) 0.25-35 MG-MCG tablet Take 1 tablet by mouth daily. 03/20/20   Sheliah Hatch, MD  sertraline (ZOLOFT) 100 MG tablet Take 1 tablet (100 mg total) by mouth daily. 03/20/20   Sheliah Hatch, MD    Allergies    Bevelyn Buckles, Peanut-containing drug products, Avocado, Cat hair extract, Dog epithelium, Grass pollen(k-o-r-t-swt vern), Other, and Pollen extract  Review of Systems   Review of Systems  Constitutional:  Negative for fever.  HENT: Positive for sore throat. Negative for sinus pressure and sinus pain.     Physical Exam Updated Vital Signs BP 119/80   Pulse 85   Temp 98.3 F (36.8 C) (Oral)   Resp 16   Ht 5\' 5"  (1.651 m)   Wt 58.1 kg   LMP 10/13/2020 (Exact Date)   SpO2 100%   BMI 21.30 kg/m   Physical Exam Vitals and nursing note reviewed.  Constitutional:      Appearance: She is well-developed.  HENT:     Head: Normocephalic and atraumatic.     Right Ear: Tympanic membrane normal. No swelling or tenderness. No middle ear effusion.     Left Ear: Tympanic membrane normal. No swelling or tenderness.  No middle ear effusion.     Nose: No congestion.     Mouth/Throat:     Mouth: Mucous membranes are moist.     Pharynx: Uvula midline. Oropharyngeal exudate present. No pharyngeal swelling.     Tonsils: Tonsillar exudate present. No tonsillar abscesses. 1+ on the right. 2+ on the left.     Comments: Oropharynx with mild erythema.  Small tonsillar exudates to bilateral tonsils.  2+ tonsil on the left, 1+ tonsil on the right. Eyes:     Conjunctiva/sclera: Conjunctivae normal.  Cardiovascular:     Rate and Rhythm: Normal rate.  Pulmonary:     Effort: Pulmonary effort is normal.  Abdominal:     Palpations: Abdomen is soft.  Skin:    General: Skin is warm and dry.  Neurological:     Mental Status: She is alert and oriented to person, place, and time.     ED Results / Procedures / Treatments   Labs (all labs ordered are listed, but only abnormal results are displayed) Labs Reviewed  GROUP A STREP BY PCR    EKG None  Radiology No results found.  Procedures Procedures   Medications Ordered in ED Medications  dexamethasone (DECADRON) injection 10 mg (10 mg Intramuscular Given 10/21/20 2251)  alum & mag hydroxide-simeth (MAALOX/MYLANTA) 200-200-20 MG/5ML suspension 30 mL (30 mLs Oral Given 10/21/20 2249)    And  lidocaine (XYLOCAINE) 2 % viscous mouth solution 15 mL (15  mLs Oral Given 10/21/20 2249)    ED Course  I have reviewed the triage vital signs and the nursing notes.  Pertinent labs & imaging results that were available during my care of the patient were reviewed by me and considered in my medical decision making (see chart for details).    MDM Rules/Calculators/A&P  Patient presents to the ED with a chief complaint of sore throat for the past 2 weeks.  Evaluated in the emergency department approximately 6 days ago,  had a negative strep pharyngitis then.  Does report she has a recurrent history of strep pharyngitis, she was supposed to schedule an appointment with ENT, however was unable to do so due to missing work as she states.  She arrived in the ED afebrile, tolerating his secretions, oxygen saturations 100% on room air.  Normotensive.  Oropharynx appears with some mild erythema, bilateral tonsillar exudates which were noted during her last visit.  Left tonsil does appear larger than right tonsil.  No trismus on my exam.  Lungs are clear to auscultation, full range of motion of the neck.  We will provide a strep PCR in order to further evaluate patient.  10:36 PM Strep pharyngitis test is negative at this time.   We discussed symptomatic treatment at this time.  She was given a shot of steroids along with some viscous lidocaine to help with pain control.  She is aware of symptomatic treatment at this time along with follow-up with ENT.  Patient understands and agrees with management, return precautions discussed at length.  Patient stable for discharge.  Portions of this note were generated with Scientist, clinical (histocompatibility and immunogenetics). Dictation errors may occur despite best attempts at proofreading.  Final Clinical Impression(s) / ED Diagnoses Final diagnoses:  Sore throat    Rx / DC Orders ED Discharge Orders         Ordered    lidocaine (XYLOCAINE) 2 % solution  As needed        10/21/20 2300           Claude Manges, PA-C 10/21/20 2302    Charlynne Pander, MD 10/22/20 [phone]

## 2020-10-22 DIAGNOSIS — R102 Pelvic and perineal pain: Secondary | ICD-10-CM | POA: Diagnosis not present

## 2020-11-12 ENCOUNTER — Ambulatory Visit: Payer: BC Managed Care – PPO | Admitting: Family Medicine

## 2020-11-12 DIAGNOSIS — J0391 Acute recurrent tonsillitis, unspecified: Secondary | ICD-10-CM | POA: Diagnosis not present

## 2020-11-13 ENCOUNTER — Ambulatory Visit: Payer: Self-pay | Admitting: Family Medicine

## 2020-11-20 DIAGNOSIS — R059 Cough, unspecified: Secondary | ICD-10-CM | POA: Diagnosis not present

## 2020-11-20 DIAGNOSIS — J029 Acute pharyngitis, unspecified: Secondary | ICD-10-CM | POA: Diagnosis not present

## 2020-11-20 DIAGNOSIS — Z20822 Contact with and (suspected) exposure to covid-19: Secondary | ICD-10-CM | POA: Diagnosis not present

## 2020-11-26 ENCOUNTER — Other Ambulatory Visit: Payer: Self-pay

## 2020-11-26 ENCOUNTER — Ambulatory Visit: Payer: Self-pay | Admitting: Family Medicine

## 2020-11-26 DIAGNOSIS — Z309 Encounter for contraceptive management, unspecified: Secondary | ICD-10-CM

## 2020-11-26 MED ORDER — NORGESTIMATE-ETH ESTRADIOL 0.25-35 MG-MCG PO TABS: 1.0000 | ORAL_TABLET | Freq: Every day | ORAL | 0 refills | Status: DC

## 2020-11-27 ENCOUNTER — Encounter: Payer: Self-pay | Admitting: Family Medicine

## 2020-11-27 ENCOUNTER — Other Ambulatory Visit: Payer: Self-pay

## 2020-11-27 ENCOUNTER — Ambulatory Visit (INDEPENDENT_AMBULATORY_CARE_PROVIDER_SITE_OTHER): Payer: BC Managed Care – PPO | Admitting: Family Medicine

## 2020-11-27 DIAGNOSIS — F1010 Alcohol abuse, uncomplicated: Secondary | ICD-10-CM | POA: Diagnosis not present

## 2020-11-27 DIAGNOSIS — F331 Major depressive disorder, recurrent, moderate: Secondary | ICD-10-CM

## 2020-11-27 MED ORDER — SERTRALINE HCL 100 MG PO TABS: 100.0000 mg | ORAL_TABLET | Freq: Every day | ORAL | 1 refills | Status: DC
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
## 2020-11-27 NOTE — Assessment & Plan Note (Signed)
New.  Pt is self medicating her depression w/ alcohol and weed.  We discussed outpt drug and alcohol counseling at [facility].  She is aware that her polysubstance use is further worsening her depression and would like to address it.  Will follow closely.

## 2020-11-27 NOTE — Patient Instructions (Signed)
Follow up in 2 weeks to recheck mood Restart the Sertraline daily Please call [facility] [place] ASAP [phone] ANY thoughts of self harm, call the crisis line at [phone] OR go to [facility] or [employer] with any questions or concerns Hang in there!!
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
## 2020-11-28 ENCOUNTER — Encounter: Payer: Self-pay | Admitting: Family Medicine

## 2020-11-30 ENCOUNTER — Telehealth: Payer: Self-pay | Admitting: Family Medicine

## 2020-11-30 DIAGNOSIS — Z0279 Encounter for issue of other medical certificate: Secondary | ICD-10-CM

## 2020-11-30 NOTE — Telephone Encounter (Signed)
Noted! Thank you

## 2020-11-30 NOTE — Telephone Encounter (Signed)
Patient would like 8 days a month on her FMLA - Patient would like a list of any dates that she was out between January & May added to her forms.

## 2020-12-04 DIAGNOSIS — J3501 Chronic tonsillitis: Secondary | ICD-10-CM | POA: Diagnosis not present

## 2020-12-04 DIAGNOSIS — J0391 Acute recurrent tonsillitis, unspecified: Secondary | ICD-10-CM | POA: Diagnosis not present

## 2020-12-17 ENCOUNTER — Encounter: Payer: Self-pay | Admitting: Family Medicine

## 2020-12-17 ENCOUNTER — Other Ambulatory Visit: Payer: Self-pay

## 2020-12-17 ENCOUNTER — Ambulatory Visit: Payer: BC Managed Care – PPO | Admitting: Family Medicine

## 2020-12-17 VITALS — BP 100/80 | HR 78 | Temp 98.5°F | Resp 18 | Ht 65.0 in | Wt 131.6 lb

## 2020-12-17 DIAGNOSIS — F32A Depression, unspecified: Secondary | ICD-10-CM | POA: Diagnosis not present

## 2020-12-17 DIAGNOSIS — F419 Anxiety disorder, unspecified: Secondary | ICD-10-CM

## 2020-12-17 NOTE — Progress Notes (Signed)
   Subjective:    Patient ID: Annette Hart, female    DOB: 03-25-2001, 20 y.o.   MRN: [id number]  HPI Anxiety/depression- pt was started on Sertraline 100mg  last visit.  'i almost got committed to a mental hospital yesterday'.  Pt got dumped shortly after our last appt and she has not been doing well since.  Pt reports she couldn't stop crying yesterday and mom called the crisis line.  Has appt upcoming w/ [facility].  Moved back in w/ mom.  Is not planning on going back to school b/c she has to work full time to afford expenses.  Pt states she is no longer drinking.  Continues to smoke weed.  Pt admits to not taking medication 'like I should'.  'i genuinely don't think life is going to get better'.  'the thought of going to work makes me physically sick'.    Review of Systems For ROS see HPI   This visit occurred during the SARS-CoV-2 public health emergency.  Safety protocols were in place, including screening questions prior to the visit, additional usage of staff PPE, and extensive cleaning of exam room while observing appropriate contact time as indicated for disinfecting solutions.      Objective:   Physical Exam Vitals reviewed.  Constitutional:      General: She is not in acute distress.    Appearance: Normal appearance. She is not ill-appearing.  HENT:     Head: Normocephalic and atraumatic.  Skin:    General: Skin is warm and dry.  Neurological:     General: No focal deficit present.     Mental Status: She is alert and oriented to person, place, and time.  Psychiatric:     Comments: Flat affect, little insight or emotion          Assessment & Plan:

## 2020-12-17 NOTE — Patient Instructions (Signed)
Follow up in 2 weeks to recheck mood Please call and schedule an appt with a local psychiatrist - [facility] [phone] [facility] [phone] - [facility] [phone] - [facility] [phone] Please ask the therapist at [facility] if they have any other recommendations Please make sure you go to your therapy appt- you deserve to feel better IF you are having an emergency, please call 911, go to the ER, or go to the [facility] at [phone] [phone] Continue the Sertraline at this time.  Please make sure you are taking it every day. Call with any questions or concerns Hang in there!
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
## 2020-12-21 DIAGNOSIS — F331 Major depressive disorder, recurrent, moderate: Secondary | ICD-10-CM | POA: Diagnosis not present

## 2020-12-30 ENCOUNTER — Ambulatory Visit: Payer: BC Managed Care – PPO | Admitting: Family Medicine

## 2021-01-06 ENCOUNTER — Encounter: Payer: Self-pay | Admitting: *Deleted

## 2021-01-18 ENCOUNTER — Telehealth: Payer: BC Managed Care – PPO | Admitting: Physician Assistant

## 2021-01-18 ENCOUNTER — Encounter: Payer: Self-pay | Admitting: Physician Assistant

## 2021-01-18 DIAGNOSIS — B9689 Other specified bacterial agents as the cause of diseases classified elsewhere: Secondary | ICD-10-CM | POA: Diagnosis not present

## 2021-01-18 DIAGNOSIS — N760 Acute vaginitis: Secondary | ICD-10-CM

## 2021-01-18 MED ORDER — METRONIDAZOLE 500 MG PO TABS: 500.0000 mg | ORAL_TABLET | Freq: Two times a day (BID) | ORAL | 0 refills | Status: AC

## 2021-01-18 NOTE — Progress Notes (Signed)
Ms. Annette, perfetti are scheduled for a virtual visit with your provider today.    Just as we do with appointments in the office, we must obtain your consent to participate.  Your consent will be active for this visit and any virtual visit you may have with one of our providers in the next 365 days.    If you have a MyChart account, I can also send a copy of this consent to you electronically.  All virtual visits are billed to your insurance company just like a traditional visit in the office.  As this is a virtual visit, video technology does not allow for your provider to perform a traditional examination.  This may limit your provider's ability to fully assess your condition.  If your provider identifies any concerns that need to be evaluated in person or the need to arrange testing such as labs, EKG, etc, we will make arrangements to do so.    Although advances in technology are sophisticated, we cannot ensure that it will always work on either your end or our end.  If the connection with a video visit is poor, we may have to switch to a telephone visit.  With either a video or telephone visit, we are not always able to ensure that we have a secure connection.   I need to obtain your verbal consent now.   Are you willing to proceed with your visit today?   Annette Hart has provided verbal consent on 01/18/2021 for a virtual visit (video or telephone).   Annette Loveless, PA-C 01/18/2021  9:49 AM  Virtual Visit Consent   Annette Hart, you are scheduled for a virtual visit with a [facility] provider today.     Just as with appointments in the office, your consent must be obtained to participate.  Your consent will be active for this visit and any virtual visit you may have with one of our providers in the next 365 days.     If you have a MyChart account, a copy of this consent can be sent to you electronically.  All virtual visits are billed to your insurance company just like a  traditional visit in the office.    As this is a virtual visit, video technology does not allow for your provider to perform a traditional examination.  This may limit your provider's ability to fully assess your condition.  If your provider identifies any concerns that need to be evaluated in person or the need to arrange testing (such as labs, EKG, etc.), we will make arrangements to do so.     Although advances in technology are sophisticated, we cannot ensure that it will always work on either your end or our end.  If the connection with a video visit is poor, the visit may have to be switched to a telephone visit.  With either a video or telephone visit, we are not always able to ensure that we have a secure connection.     I need to obtain your verbal consent now.   Are you willing to proceed with your visit today?    Annette Hart has provided verbal consent on 01/18/2021 for a virtual visit (video or telephone).   Annette Loveless, PA-C   Date: 01/18/2021 9:49 AM   Virtual Visit via Video Note   IMargaretann Hart, connected with  Annette Hart  ([id number], July 08, 2001) on 01/18/21 at  9:30 AM EDT by a video-enabled telemedicine application and verified that I  am speaking with the correct person using two identifiers.  Location: Patient: Virtual Visit Location Patient: Home Provider: Virtual Visit Location Provider: Home Office   I discussed the limitations of evaluation and management by telemedicine and the availability of in person appointments. The patient expressed understanding and agreed to proceed.    History of Present Illness: Annette Hart is a 20 y.o. who identifies as a female who was assigned female at birth, and is being seen today for vaginal discharge.  HPI: Vaginal Discharge The patient's primary symptoms include a genital odor and vaginal discharge. The patient's pertinent negatives include no genital itching, genital lesions or genital rash. This is  a new problem. The current episode started today. The problem occurs constantly. The problem has been unchanged. The pain is mild. She is not pregnant. Associated symptoms include abdominal pain and back pain. Pertinent negatives include no dysuria, fever, flank pain, frequency or nausea. The vaginal discharge was green, watery, yellow, thin and malodorous. There has been no bleeding. Nothing aggravates the symptoms. She has tried nothing for the symptoms. She is sexually active. It is unknown whether or not her partner has an STD.    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   Alcohol abuse 11/27/2020   Physical exam 06/23/2020   Borderline personality disorder (HCC) 03/20/2020   Dysmenorrhea in adolescent 11/10/2016   Anxiety and depression 11/10/2016   GERD (gastroesophageal reflux disease) 11/10/2016   Migraine without aura and without status migrainosus, not intractable 08/12/2016   Allergic reaction 11/07/2013    Allergies:  Allergies  Allergen Reactions   Justicia Adhatoda Anaphylaxis   Peanut-Containing Drug Products Anaphylaxis   Avocado    Cat Hair Extract    Dog Epithelium    Grass Pollen(K-O-R-T-Swt Vern)    Other     Too much cheese/dairy- GI upset Tree nuts, Environmental Allergies, Feathers, Dogs, Cats    Pollen Extract    Medications:  Current Outpatient Medications:    metroNIDAZOLE (FLAGYL) 500 MG tablet, Take 1 tablet (500 mg total) by mouth 2 (two) times daily for 7 days., Disp: 14 tablet, Rfl: 0   albuterol (VENTOLIN HFA) 108 (90 Base) MCG/ACT inhaler, Inhale 1-2 puffs into the lungs every 6 (six) hours as needed for wheezing or shortness of breath., Disp: , Rfl:    ferrous sulfate 325 (65 FE) MG tablet, Take 325 mg by mouth daily., Disp: , Rfl:    fexofenadine (ALLEGRA) 180 MG tablet, Take 180 mg by mouth daily., Disp: , Rfl:    naproxen (NAPROSYN) 375 MG tablet, Take 1 tablet (375 mg  total) by mouth 2 (two) times daily with a meal., Disp: 30 tablet, Rfl: 0   norgestimate-ethinyl estradiol (ESTARYLLA) 0.25-35 MG-MCG tablet, Take 1 tablet by mouth daily., Disp: 84 tablet, Rfl: 0   sertraline (ZOLOFT) 100 MG tablet, Take 1 tablet (100 mg total) by mouth daily., Disp: 90 tablet, Rfl: 1  Observations/Objective: Patient is well-developed, well-nourished in no acute distress.  Resting comfortably at home.  Head is normocephalic, atraumatic.  No labored breathing.  Speech is clear and coherent with logical content.  Patient is alert and oriented at baseline.    Assessment and Plan: 1. BV (bacterial vaginosis) - metroNIDAZOLE (FLAGYL) 500 MG tablet; Take 1 tablet (500 mg total) by mouth 2 (two) times daily for 7 days.  Dispense: 14 tablet; Refill: 0 - Discussed possibility of STI and testing, advised PCP, GYN, UC,  or local health department - Will treat for possible BV and/or trichomonas as above -  Safe sex practices - Seek in person evaluation if symptoms do not improve or worsen   Follow Up Instructions: I discussed the assessment and treatment plan with the patient. The patient was provided an opportunity to ask questions and all were answered. The patient agreed with the plan and demonstrated an understanding of the instructions.  A copy of instructions were sent to the patient via MyChart.  The patient was advised to call back or seek an in-person evaluation if the symptoms worsen or if the condition fails to improve as anticipated.  Time:  I spent 12 minutes with the patient via telehealth technology discussing the above problems/concerns.    Annette Loveless, PA-C

## 2021-01-18 NOTE — Patient Instructions (Signed)
Bacterial Vaginosis  Bacterial vaginosis is an infection that occurs when the normal balance of bacteria in the vagina changes. This change is caused by an overgrowth of certain bacteria in the vagina. Bacterial vaginosis is the most common vaginalinfection among females aged 20 to 44 years. This condition increases the risk of sexually transmitted infections (STIs). Treatment can help reduce this risk. Treatment is very important for pregnant women because this condition can cause babies to be born early (prematurely) or at a low birth weight. What are the causes? This condition is caused by an increase in harmful bacteria that are normally present in small amounts in the vagina. However, the exact reason thiscondition develops is not known. You cannot get bacterial vaginosis from toilet seats, bedding, swimming pools,or contact with objects around you. What increases the risk? The following factors may make you more likely to develop this condition: Having a new sexual partner or multiple sexual partners, or having unprotected sex. Douching. Having an intrauterine device (IUD). Smoking. Abusing drugs and alcohol. This may lead to riskier sexual behavior. Taking certain antibiotic medicines. Being pregnant. What are the signs or symptoms? Some women with this condition have no symptoms. Symptoms may include: Gray or white vaginal discharge. The discharge can be watery or foamy. A fish-like odor with discharge, especially after sex or during menstruation. Itching in and around the vagina. Burning or pain with urination. How is this diagnosed? This condition is diagnosed based on: Your medical history. A physical exam of the vagina. Checking a sample of vaginal fluid for harmful bacteria or abnormal cells. How is this treated? This condition is treated with antibiotic medicines. These may be given as a pill, a vaginal cream, or a medicine that is put into the vagina (suppository). If the  condition comes back after treatment, a second round of antibioticsmay be needed. Follow these instructions at home: Medicines Take or apply over-the-counter and prescription medicines only as told by your health care provider. Take or apply your antibiotic medicine as told by your health care provider. Do not stop using the antibiotic even if you start to feel better. General instructions If you have a female sexual partner, tell her that you have a vaginal infection. She should follow up with her health care provider. If you have a female sexual partner, he does not need treatment. Avoid sexual activity until you finish treatment. Drink enough fluid to keep your urine pale yellow. Keep the area around your vagina and rectum clean. Wash the area daily with warm water. Wipe yourself from front to back after using the toilet. If you are breastfeeding, talk to your health care provider about continuing breastfeeding during treatment. Keep all follow-up visits. This is important. How is this prevented? Self-care Do not douche. Wash the outside of your vagina with warm water only. Wear cotton or cotton-lined underwear. Avoid wearing tight pants and pantyhose, especially during the summer. Safe sex Use protection when having sex. This includes: Using condoms. Using dental dams. This is a thin layer of a material made of latex or polyurethane that protects the mouth during oral sex. Limit the number of sexual partners. To help prevent bacterial vaginosis, it is best to have sex with just one partner (monogamous relationship). Make sure you and your sexual partner are tested for STIs. Drugs and alcohol Do not use any products that contain nicotine or tobacco. These products include cigarettes, chewing tobacco, and vaping devices, such as e-cigarettes. If you need help quitting, ask your health   care provider. Do not use drugs. Do not drink alcohol if: Your health care provider tells you not to do  this. You are pregnant, may be pregnant, or are planning to become pregnant. If you drink alcohol: Limit how much you have to 0-1 drink a day. Be aware of how much alcohol is in your drink. In the [place] one drink equals one 12 oz bottle of beer (355 mL), one 5 oz glass of wine (148 mL), or one 1 oz glass of hard liquor (44 mL). Where to find more information Centers for Disease Control and Prevention: FootballExhibition.com.br American Sexual Health Association (ASHA): www.ashastd.org [place] Department of Health and Health and safety inspector, Office on Women's Health: http://hoffman.com/ Contact a health care provider if: Your symptoms do not improve, even after treatment. You have more discharge or pain when urinating. You have a fever or chills. You have pain in your abdomen or pelvis. You have pain during sex. You have vaginal bleeding between menstrual periods. Summary Bacterial vaginosis is a vaginal infection that occurs when the normal balance of bacteria in the vagina changes. It results from an overgrowth of certain bacteria. This condition increases the risk of sexually transmitted infections (STIs). Getting treated can help reduce this risk. Treatment is very important for pregnant women because this condition can cause babies to be born early (prematurely) or at low birth weight. This condition is treated with antibiotic medicines. These may be given as a pill, a vaginal cream, or a medicine that is put into the vagina (suppository). This information is not intended to replace advice given to you by your health care provider. Make sure you discuss any questions you have with your healthcare provider. Document Revised: 12/26/2019 Document Reviewed: 12/26/2019 Elsevier Patient Education  2022 Elsevier Inc.   Trichomoniasis Trichomoniasis is an STI (sexually transmitted infection) that can affect both women and men. In women, the outer area of the female genitalia (vulva) and the vagina are affected. In  men, mainly the penis is affected, but theprostate and other reproductive organs can also be involved.  This condition can be treated with medicine. It often has no symptoms (is asymptomatic), especially in men. If not treated, trichomoniasis can last for months oryears. What are the causes? This condition is caused by a parasite called Trichomonas vaginalis. Trichomoniasis most often spreads from person to person (is contagious) through sexual contact. What increases the risk? The following factors may make you more likely to develop this condition: Having unprotected sex. Having sex with a partner who has trichomoniasis. Having multiple sexual partners. Having had previous trichomoniasis infections or other STIs. What are the signs or symptoms? In women, symptoms of trichomoniasis include: Abnormal vaginal discharge that is clear, white, gray, or yellow-green and foamy and has an unusual "fishy" odor. Itching and irritation of the vagina and vulva. Burning or pain during urination or sex. Redness and swelling of the genitals. In men, symptoms of trichomoniasis include: Penile discharge that may be foamy or contain pus. Pain in the penis. This may happen only when urinating. Itching or irritation inside the penis. Burning after urination or ejaculation. How is this diagnosed? In women, this condition may be found during a routine Pap test or physical exam. It may be found in men during a routine physical exam. Your health care provider may do tests to help diagnose this infection, such as: Urine tests (men and women). The following in women: Testing the pH of the vagina. A vaginal swab test that checks for the  Trichomonas vaginalis parasite. Testing vaginal secretions. Your health care provider may test you for other STIs, including HIV (human immunodeficiency virus). How is this treated? This condition is treated with medicine taken by mouth (orally), such as metronidazole or  tinidazole, to fight the infection. Your sexual partner(s) also need to be tested and treated. If you are a woman and you plan to become pregnant or think you may be pregnant, tell your health care provider right away. Some medicines that are used to treat the infection should not be taken during pregnancy. Your health care provider may recommend over-the-counter medicines or creams to help relieve itching or irritation. You may be tested for infection again 39months after treatment. Follow these instructions at home: Take and use over-the-counter and prescription medicines, including creams, only as told by your health care provider. Take your antibiotic medicine as told by your health care provider. Do not stop taking the antibiotic even if you start to feel better. Do not have sex until 7-10 days after you finish your medicine, or until your health care provider approves. Ask your health care provider when you may start to have sex again. (Women) Do not douche or wear tampons while you have the infection. Discuss your infection with your sexual partner(s). Make sure that your partner gets tested and treated, if necessary. Keep all follow-up visits as told by your health care provider. This is important. How is this prevented?  Use condoms every time you have sex. Using condoms correctly and consistently can help protect against STIs. Avoid having multiple sexual partners. Talk with your sexual partner about any symptoms that either of you may have, as well as any history of STIs. Get tested for STIs and STDs (sexually transmitted diseases) before you have sex. Ask your partner to do the same. Do not have sexual contact if you have symptoms of trichomoniasis or another STI. Contact a health care provider if: You still have symptoms after you finish your medicine. You develop pain in your abdomen. You have pain when you urinate. You have bleeding after sex. You develop a rash. You feel  nauseous or you vomit. You plan to become pregnant or think you may be pregnant. Summary Trichomoniasis is an STI (sexually transmitted infection) that can affect both women and men. This condition often has no symptoms (is asymptomatic), especially in men. Without treatment, this condition can last for months or years. You should not have sex until 7-10 days after you finish your medicine, or until your health care provider approves. Ask your health care provider when you may start to have sex again. Discuss your infection with your sexual partner(s). Make sure that your partner gets tested and treated, if necessary. This information is not intended to replace advice given to you by your health care provider. Make sure you discuss any questions you have with your healthcare provider. Document Revised: 04/10/2018 Document Reviewed: 04/10/2018 Elsevier Patient Education  2022 [employer].   Safe Sex Practicing safe sex means taking steps before and during sex to reduce your risk of: Getting an STI (sexually transmitted infection). Giving your partner an STI. Unwanted or unplanned pregnancy. How to practice safe sex Ways you can practice safe sex  Limit your sexual partners to only one partner who is having sex with only you. Avoid using alcohol and drugs before having sex. Alcohol and drugs can affect your judgment. Before having sex with a new partner: Talk to your partner about past partners, past STIs, and drug  use. Get screened for STIs and discuss the results with your partner. Ask your partner to get screened too. Check your body regularly for sores, blisters, rashes, or unusual discharge. If you notice any of these problems, visit your health care provider. Avoid sexual contact if you have symptoms of an infection or you are being treated for an STI. While having sex, use a condom. Make sure to: Use a condom every time you have vaginal, oral, or anal sex. Both females and males  should wear condoms during oral sex. Keep condoms in place from the beginning to the end of sexual activity. Use a latex condom, if possible. Latex condoms offer the best protection. Use only water-based lubricants with a condom. Using petroleum-based lubricants or oils will weaken the condom and increase the chance that it will break.  Ways your health care provider can help you practice safe sex  See your health care provider for regular screenings, exams, and tests for STIs. Talk with your health care provider about what kind of birth control (contraception) is best for you. Get vaccinated against hepatitis B and human papillomavirus (HPV). If you are at risk of being infected with HIV (human immunodeficiency virus), talk with your health care provider about taking a prescription medicine to prevent HIV infection. You are at risk for HIV if you: Are a man who has sex with other men. Are sexually active with more than one partner. Take drugs by injection. Have a sex partner who has HIV. Have unprotected sex. Have sex with someone who has sex with both men and women. Have had an STI.  Follow these instructions at home: Take over-the-counter and prescription medicines only as told by your health care provider. Keep all follow-up visits. This is important. Where to find more information Centers for Disease Control and Prevention: FootballExhibition.com.br Planned Parenthood: www.plannedparenthood.org Office on [employer] Health: http://hoffman.com/ Summary Practicing safe sex means taking steps before and during sex to reduce your risk getting an STI, giving your partner an STI, and having an unwanted or unplanned pregnancy. Before having sex with a new partner, talk to your partner about past partners, past STIs, and drug use. Use a condom every time you have vaginal, oral, or anal sex. Both females and males should wear condoms during oral sex. Check your body regularly for sores, blisters, rashes, or  unusual discharge. If you notice any of these problems, visit your health care provider. See your health care provider for regular screenings, exams, and tests for STIs. This information is not intended to replace advice given to you by your health care provider. Make sure you discuss any questions you have with your healthcare provider. Document Revised: 12/02/2019 Document Reviewed: 12/02/2019 Elsevier Patient Education  2022 [employer].

## 2021-01-22 ENCOUNTER — Other Ambulatory Visit: Payer: Self-pay

## 2021-01-22 ENCOUNTER — Ambulatory Visit (INDEPENDENT_AMBULATORY_CARE_PROVIDER_SITE_OTHER): Payer: BC Managed Care – PPO | Admitting: Family Medicine

## 2021-01-22 ENCOUNTER — Encounter: Payer: Self-pay | Admitting: Family Medicine

## 2021-01-22 VITALS — BP 116/80 | HR 81 | Temp 98.2°F | Resp 16 | Ht 65.0 in | Wt 129.8 lb

## 2021-01-22 DIAGNOSIS — F32A Depression, unspecified: Secondary | ICD-10-CM | POA: Diagnosis not present

## 2021-01-22 DIAGNOSIS — F419 Anxiety disorder, unspecified: Secondary | ICD-10-CM

## 2021-01-22 DIAGNOSIS — F1210 Cannabis abuse, uncomplicated: Secondary | ICD-10-CM | POA: Diagnosis not present

## 2021-01-22 MED ORDER — QUETIAPINE FUMARATE ER 50 MG PO TB24: ORAL_TABLET | ORAL | 3 refills | Status: DC

## 2021-01-22 NOTE — Patient Instructions (Signed)
I will be out of the office in August but please schedule an appt in early September if possible ADD the Quetiapine (Seroquel) nightly- 1 tab for the first 4 nights and then 2 tabs nightly Please call and schedule an appt with a local psychiatrist - [facility] [phone] [facility] [phone] - [facility] [phone] - [facility] [phone] Please ask the therapist at [facility] if they have any other recommendations Please make sure you go to your therapy appt- you deserve to feel better IF you are having an emergency, please call 911, go to the ER, or go to the [facility] at [address] [phone] Continue the Sertraline at this time.  Please make sure you are taking it every day. Try and decrease your marijuana use Call with any questions or concerns Hang in there!

## 2021-01-22 NOTE — Progress Notes (Signed)
   Subjective:    Patient ID: Annette Hart, female    DOB: [birth year], 20 y.o.   MRN: [id number]  HPI Anxiety/Depression- pt reports taking her Sertraline 100mg  daily.  PHQ=24 today.  Pt reports she has to force herself to eat since her tonsillectomy in May.  Says she wants to 'cry constantly'.  Unable to focus.  Feels very anxious- to the point of nausea.  Smoking marijuana 'constantly'.  Finds it hard to stay sober for her 10 hr work day.  'i have to get wildly high to feel better'.  Moved back in w/ mom.  + financial issues.  Pt has been to therapy x1- missed 2 other appts.  On 'final written warning' at work.  Pt has difficulty w/ personal relationships- gets overly attached and then 'my world falls apart'.  Denies SI.  Has not reached out to psychiatry.  Would be interested in [facility] but doesn't feel she is able to miss the days at work.   Review of Systems For ROS see HPI   This visit occurred during the SARS-CoV-2 public health emergency.  Safety protocols were in place, including screening questions prior to the visit, additional usage of staff PPE, and extensive cleaning of exam room while observing appropriate contact time as indicated for disinfecting solutions.      Objective:   Physical Exam Vitals reviewed.  Constitutional:      General: She is not in acute distress.    Appearance: Normal appearance. She is not ill-appearing.  HENT:     Head: Normocephalic and atraumatic.  Eyes:     Extraocular Movements: Extraocular movements intact.     Conjunctiva/sclera: Conjunctivae normal.     Pupils: Pupils are equal, round, and reactive to light.  Skin:    General: Skin is warm and dry.  Neurological:     General: No focal deficit present.     Mental Status: She is alert and oriented to person, place, and time.  Psychiatric:     Comments: Flat affect, soft and slowed speech          Assessment & Plan:

## 2021-01-25 DIAGNOSIS — F1210 Cannabis abuse, uncomplicated: Secondary | ICD-10-CM | POA: Insufficient documentation

## 2021-01-25 NOTE — Assessment & Plan Note (Signed)
New to provider, ongoing issue for pt.  She reports she is smoking 'constantly' when not working and is now finding it hard to stay sober during her work day.  She reports she used to only need to smoke 'a little' to feel better but now she needs to 'get wildly high' for things to improve.  Discussed tolerance, dependence, risk of hyperemesis.  Encouraged her to decrease her frequency of use.  She really needs a dual diagnosis program but she states that due to cost and her inability to miss work that this is not an option right now.  Stressed need for her to schedule w/ a psychiatrist and to keep her f/u counseling appts.  Will follow.

## 2021-01-25 NOTE — Assessment & Plan Note (Signed)
Deteriorated.  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  She did not feel any improvement in mood w/ restarting her sertraline despite taking it daily.  She is open to idea of adding Seroquel at night to help w/ both sleep and mood.  Will start at a low dose and then titrate.  Pt expressed understanding and is in agreement w/ plan.

## 2021-01-26 DIAGNOSIS — Z113 Encounter for screening for infections with a predominantly sexual mode of transmission: Secondary | ICD-10-CM | POA: Diagnosis not present

## 2021-01-26 DIAGNOSIS — Z114 Encounter for screening for human immunodeficiency virus [HIV]: Secondary | ICD-10-CM | POA: Diagnosis not present

## 2021-02-21 ENCOUNTER — Other Ambulatory Visit: Payer: Self-pay | Admitting: Family Medicine

## 2021-02-21 DIAGNOSIS — Z309 Encounter for contraceptive management, unspecified: Secondary | ICD-10-CM

## 2021-02-22 ENCOUNTER — Other Ambulatory Visit: Payer: Self-pay

## 2021-02-22 DIAGNOSIS — Z309 Encounter for contraceptive management, unspecified: Secondary | ICD-10-CM

## 2021-02-22 MED ORDER — NORGESTIMATE-ETH ESTRADIOL 0.25-35 MG-MCG PO TABS: 1.0000 | ORAL_TABLET | Freq: Every day | ORAL | 0 refills | Status: DC

## 2021-03-03 ENCOUNTER — Ambulatory Visit: Payer: Self-pay | Admitting: Podiatry

## 2021-03-04 ENCOUNTER — Encounter (HOSPITAL_BASED_OUTPATIENT_CLINIC_OR_DEPARTMENT_OTHER): Payer: Self-pay | Admitting: *Deleted

## 2021-03-04 ENCOUNTER — Emergency Department (HOSPITAL_BASED_OUTPATIENT_CLINIC_OR_DEPARTMENT_OTHER)
Admission: EM | Admit: 2021-03-04 | Discharge: 2021-03-04 | Disposition: A | Discharge status: Home / Self Care | Payer: BC Managed Care – PPO | Attending: Emergency Medicine | Admitting: Emergency Medicine

## 2021-03-04 ENCOUNTER — Other Ambulatory Visit: Payer: Self-pay

## 2021-03-04 DIAGNOSIS — R5383 Other fatigue: Secondary | ICD-10-CM | POA: Diagnosis not present

## 2021-03-04 DIAGNOSIS — F17290 Nicotine dependence, other tobacco product, uncomplicated: Secondary | ICD-10-CM | POA: Insufficient documentation

## 2021-03-04 DIAGNOSIS — Z20822 Contact with and (suspected) exposure to covid-19: Secondary | ICD-10-CM | POA: Insufficient documentation

## 2021-03-04 DIAGNOSIS — R6883 Chills (without fever): Secondary | ICD-10-CM | POA: Insufficient documentation

## 2021-03-04 DIAGNOSIS — Z91010 Allergy to peanuts: Secondary | ICD-10-CM | POA: Diagnosis not present

## 2021-03-04 DIAGNOSIS — U071 COVID-19: Secondary | ICD-10-CM | POA: Diagnosis not present

## 2021-03-04 DIAGNOSIS — J45909 Unspecified asthma, uncomplicated: Secondary | ICD-10-CM | POA: Insufficient documentation

## 2021-03-04 LAB — RESP PANEL BY RT-PCR (FLU A&B, COVID) ARPGX2
Influenza A by PCR: NEGATIVE
Influenza B by PCR: NEGATIVE
SARS Coronavirus 2 by RT PCR: NEGATIVE

## 2021-03-04 NOTE — ED Triage Notes (Signed)
Covid sx x 6 days , body aches , fatigue

## 2021-03-04 NOTE — Discharge Instructions (Addendum)
You were tested for COVID.  If you are positive for COVID you will need to isolate per guidelines at least for 5 days.  Take Tylenol or Motrin for fever  See your doctor for follow-up  Return to ER if you have worse trouble breathing, shortness of breath, fever, dehydration

## 2021-03-04 NOTE — ED Provider Notes (Signed)
[facility] EMERGENCY DEPARTMENT Provider Note   CSN: [id number] Arrival date & time: 03/04/21  1559     History No chief complaint on file.   Annette Hart is a 20 y.o. female hx of asthma here presenting with possible COVID symptoms.  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  She states that one of her colleagues are sick with COVID.  She states that for the last 6 days, she has been having body aches and fatigue.  She states that she feeling worse today.  Denies any actual fevers.  Denies any abdominal pain or cough or urinary symptoms.  Patient states that she received both vaccines as well as a booster.  The history is provided by the patient.      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     Family History  Problem Relation Age of Onset   Hypertension Maternal Grandmother    Diabetes Maternal Grandfather    Arthritis Father    Cataracts Father    Glaucoma Father    Migraines Father    Headache Father    Hypothyroidism Mother    Headache Mother    Migraines Mother    Depression Mother    Anxiety disorder Mother    Schizophrenia Paternal Uncle    Autism Other    Seizures Neg Hx    Bipolar disorder Neg Hx    ADD / ADHD Neg Hx     Social History   Tobacco Use   Smoking status: Some Days     Types: E-cigarettes   Smokeless tobacco: Never  Vaping Use   Vaping Use: Some days  Substance Use Topics   Alcohol use: No   Drug use: Yes    Types: Marijuana    Comment: "once a month"    Home Medications Prior to Admission medications   Medication Sig Start Date End Date Taking? Authorizing Provider  albuterol (VENTOLIN HFA) 108 (90 Base) MCG/ACT inhaler Inhale 1-2 puffs into the lungs every 6 (six) hours as needed for wheezing or shortness of breath.    [provider]  ferrous sulfate 325 (65 FE) MG tablet Take 325 mg by mouth daily.    [provider]  fexofenadine (ALLEGRA) 180 MG tablet Take 180 mg by mouth daily.    [provider]  naproxen (NAPROSYN) 375 MG tablet Take 1 tablet (375 mg total) by mouth 2 (two) times daily with a meal. Patient not taking: Reported on 01/22/2021 09/06/20   Pollyann Savoy, MD  norgestimate-ethinyl estradiol (ESTARYLLA) 0.25-35 MG-MCG tablet Take 1 tablet by mouth daily. 02/22/21   Sheliah Hatch, MD  QUEtiapine (SEROQUEL XR) 50 MG TB24 24 hr tablet Take 1 tab nightly x4 days and then increase to 2 tabs nightly 01/22/21   Sheliah Hatch, MD  sertraline (ZOLOFT) 100 MG tablet Take 1 tablet (100 mg total) by mouth daily. 11/27/20   Sheliah Hatch, MD    Allergies    Bevelyn Buckles, Peanut-containing drug products, Avocado, Cat hair extract, Dog epithelium, Grass pollen(k-o-r-t-swt vern), Other, Pollen extract, and Hydrocodone-acetaminophen  Review of Systems   Review of Systems  Constitutional:  Positive for chills and fatigue.  All other systems reviewed and are negative.  Physical Exam Updated Vital Signs BP 128/84   Pulse 87   Temp 98.6 F (37 C) (Oral)   Resp 16   Ht 5\' 5"  (1.651 m)   Wt 59.4 kg   LMP 02/25/2021   SpO2 100%   BMI 21.80 kg/m   Physical Exam Vitals and nursing note reviewed.  Constitutional:      Appearance: Normal appearance.     Comments: Well appearing   HENT:      Head: Normocephalic.     Nose: Nose normal.     Mouth/Throat:     Mouth: Mucous membranes are moist.  Eyes:     Extraocular Movements: Extraocular movements intact.     Pupils: Pupils are equal, round, and reactive to light.  Cardiovascular:     Rate and Rhythm: Normal rate and regular rhythm.     Pulses: Normal pulses.     Heart sounds: Normal heart sounds.  Pulmonary:     Effort: Pulmonary effort is normal.     Breath sounds: Normal breath sounds.     Comments: Lungs clear  Abdominal:     General: Abdomen is flat.     Palpations: Abdomen is soft.  Musculoskeletal:        General: Normal range of motion.     Cervical back: Normal range of motion and neck supple.  Skin:    General: Skin is warm.     Capillary Refill: Capillary refill takes less than 2 seconds.  Neurological:     General: No focal deficit present.     Mental Status: She is oriented to person, place, and time.  Psychiatric:        Mood and Affect: Mood normal.        Behavior: Behavior normal.    ED Results / Procedures / Treatments   Labs (all labs ordered are listed, but only abnormal results are displayed) Labs Reviewed  RESP PANEL BY RT-PCR (FLU A&B, COVID) ARPGX2    EKG None  Radiology No results found.  Procedures Procedures   Medications Ordered in ED Medications - No data to display  ED Course  I have reviewed the triage vital signs and the nursing notes.  Pertinent labs & imaging results that were available during my care of the patient were reviewed by me and considered in my medical decision making (see chart for details).    MDM Rules/Calculators/A&P                           Annette Hart is a 20 y.o. female here presenting with body aches and fatigue and chills.  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  COVID is sent.  Patient is afebrile and not hypoxic. Patient is otherwise healthy showed I told her that she can take Tylenol Motrin for fevers.   Told her to check MyChart and isolate per guidelines  Annette Hart was evaluated in Emergency Department on 03/04/2021 for the symptoms described in the history of present illness. She was  evaluated in the context of the global COVID-19 pandemic, which necessitated consideration that the patient might be at risk for infection with the SARS-CoV-2 virus that causes COVID-19. Institutional protocols and algorithms that pertain to the evaluation of patients at risk for COVID-19 are in a state of rapid change based on information released by regulatory bodies including the CDC and federal and state organizations. These policies and algorithms were followed during the patient's care in the ED.    Final Clinical Impression(s) / ED Diagnoses Final diagnoses:  None    Rx / DC Orders ED Discharge Orders     None        Charlynne Pander, MD 03/04/21 1724

## 2021-03-10 ENCOUNTER — Ambulatory Visit: Payer: BC Managed Care – PPO | Admitting: Registered Nurse

## 2021-03-12 ENCOUNTER — Telehealth (INDEPENDENT_AMBULATORY_CARE_PROVIDER_SITE_OTHER): Payer: BC Managed Care – PPO | Admitting: Registered Nurse

## 2021-03-12 ENCOUNTER — Other Ambulatory Visit: Payer: Self-pay

## 2021-03-12 ENCOUNTER — Encounter: Payer: Self-pay | Admitting: Registered Nurse

## 2021-03-12 VITALS — Wt 131.0 lb

## 2021-03-12 DIAGNOSIS — F32A Depression, unspecified: Secondary | ICD-10-CM

## 2021-03-12 DIAGNOSIS — N946 Dysmenorrhea, unspecified: Secondary | ICD-10-CM

## 2021-03-12 DIAGNOSIS — F419 Anxiety disorder, unspecified: Secondary | ICD-10-CM

## 2021-03-12 MED ORDER — QUETIAPINE FUMARATE 50 MG PO TABS: 25.0000 mg | ORAL_TABLET | Freq: Every day | ORAL | 0 refills | Status: DC

## 2021-03-12 MED ORDER — QUETIAPINE FUMARATE ER 50 MG PO TB24
ORAL_TABLET | ORAL | 3 refills | Status: DC
Start: 2021-03-12 — End: 2022-02-18

## 2021-03-12 MED ORDER — QUETIAPINE FUMARATE 25 MG PO TABS: 25.0000 mg | ORAL_TABLET | Freq: Every day | ORAL | 0 refills | Status: DC

## 2021-03-12 NOTE — Progress Notes (Signed)
Telemedicine Encounter- SOAP NOTE Established Patient  This video encounter was conducted with the patient's (or proxy's) verbal consent via audio telecommunications: yes  Patient was instructed to have this encounter in a suitably private space; and to only have persons present to whom they give permission to participate. In addition, patient identity was confirmed by use of name plus two identifiers (DOB and address).  I discussed the limitations, risks, security and privacy concerns of performing an evaluation and management service by telephone and the availability of in person appointments. I also discussed with the patient that there may be a patient responsible charge related to this service. The patient expressed understanding and agreed to proceed.  I spent a total of 18 minutes talking with the patient or their proxy.  Patient at home Provider in office  Participants: Jari Sportsman, NP and [name]  Chief Complaint  Patient presents with   Anxiety    Patient is needing work accommodations pt notes she has a hard time at work due to mental illness as well as menses cramping.    Depression    Patient is needing work accommodations   Menstrual Problem    Pt notes sever cramping preventing her from walking well and she misses work due to this, has had [place] in past with no results     Subjective   Illiana Hart is a 20 y.o. established patient. Video visit today for anxiety and depression  HPI Anxiety and depression Ongoing, worsening. Work is making it worse. She does note that seroquel has been helping, needs to stay on it consistently.  Also taking zoloft 100mg  po qd with good effect, but again wants to improve her compliance.  Menstrual cramps Severe. Has followed with gyn in the past. Notes that at times they preclude participation in work Has had in past without significant findings. No particularly heavy bleeding or clotting No new or different  symptoms. Takes naproxen with some effect when needed  Notes that this causes her to miss work. She needs documentation stating this. Ideally she will have absences of 4-8 hours 3-4 times each month at most. She endorses that she does not intend to use the entirety of this time but wants to ensure she is covered.   Patient Active Problem List   Diagnosis Date Noted   Marijuana abuse 01/25/2021   Moderate recurrent major depression (HCC) 11/27/2020   Alcohol abuse 11/27/2020   Physical exam 06/23/2020   Borderline personality disorder (HCC) 03/20/2020   Dysmenorrhea in adolescent 11/10/2016   Anxiety and depression 11/10/2016   GERD (gastroesophageal reflux disease) 11/10/2016   Migraine without aura and without status migrainosus, not intractable 08/12/2016   Allergic reaction 11/07/2013    Past Medical History:  Diagnosis Date   Asthma    Seasonal allergies     Current Outpatient Medications  Medication Sig Dispense Refill   albuterol (VENTOLIN HFA) 108 (90 Base) MCG/ACT inhaler Inhale 1-2 puffs into the lungs every 6 (six) hours as needed for wheezing or shortness of breath.     ferrous sulfate 325 (65 FE) MG tablet Take 325 mg by mouth daily.     fexofenadine (ALLEGRA) 180 MG tablet Take 180 mg by mouth daily.     QUEtiapine (SEROQUEL) 50 MG tablet Take 0.5-1 tablets (25-50 mg total) by mouth at bedtime. 90 tablet 0   sertraline (ZOLOFT) 100 MG tablet Take 1 tablet (100 mg total) by mouth daily. 90 tablet 1   naproxen (NAPROSYN)  375 MG tablet Take 1 tablet (375 mg total) by mouth 2 (two) times daily with a meal. (Patient not taking: Reported on 03/12/2021) 30 tablet 0   QUEtiapine (SEROQUEL XR) 50 MG TB24 24 hr tablet Take 1 tab nightly x4 days and then increase to 2 tabs nightly 60 tablet 3   No current facility-administered medications for this visit.    Allergies  Allergen Reactions   Justicia Adhatoda Anaphylaxis   Peanut-Containing Drug Products Anaphylaxis   Avocado     Cat Hair Extract    Dog Epithelium    Grass Pollen(K-O-R-T-Swt Vern)    Other     Too much cheese/dairy- GI upset Tree nuts, Environmental Allergies, Feathers, Dogs, Cats    Pollen Extract    Hydrocodone-Acetaminophen Rash    Social History   Socioeconomic History   Marital status: Single    Spouse name: Not on file   Number of children: Not on file   Years of education: Not on file   Highest education level: Not on file  Occupational History   Not on file  Tobacco Use   Smoking status: Some Days    Types: E-cigarettes   Smokeless tobacco: Never  Vaping Use   Vaping Use: Some days  Substance and Sexual Activity   Alcohol use: No   Drug use: Yes    Types: Marijuana    Comment: "once a month"   Sexual activity: Never  Other Topics Concern   Not on file  Social History Narrative   Currently at [employer]   Social Determinants of Health   Financial Resource Strain: Not on file  Food Insecurity: Not on file  Transportation Needs: Not on file  Physical Activity: Not on file  Stress: Not on file  Social Connections: Not on file  Intimate Partner Violence: Not on file    ROS Per hpi   Objective   Vitals as reported by the patient: Today's Vitals   03/12/21 1139  Weight: 131 lb (59.4 kg)    Annette Hart was seen today for anxiety, depression and menstrual problem.  Diagnoses and all orders for this visit:  Anxiety and depression -     QUEtiapine (SEROQUEL XR) 50 MG TB24 24 hr tablet; Take 1 tab nightly x4 days and then increase to 2 tabs nightly -     Discontinue: QUEtiapine (SEROQUEL) 25 MG tablet; Take 1 tablet (25 mg total) by mouth at bedtime. -     QUEtiapine (SEROQUEL) 50 MG tablet; Take 0.5-1 tablets (25-50 mg total) by mouth at bedtime.  Dysmenorrhea   PLAN Encourage her to follow up with Gyn for better control of dysmenorrhea Continue seroquel and zoloft Can complete paperwork when she has this sent to [place] Return as scheduled with pcp Patient  encouraged to call clinic with any questions, comments, or concerns.  I discussed the assessment and treatment plan with the patient. The patient was provided an opportunity to ask questions and all were answered. The patient agreed with the plan and demonstrated an understanding of the instructions.   The patient was advised to call back or seek an in-person evaluation if the symptoms worsen or if the condition fails to improve as anticipated.  I provided 18 minutes of face-to-face time during this encounter.  Janeece Agee, NP

## 2021-03-24 DIAGNOSIS — F603 Borderline personality disorder: Secondary | ICD-10-CM | POA: Diagnosis not present

## 2021-04-20 ENCOUNTER — Telehealth (INDEPENDENT_AMBULATORY_CARE_PROVIDER_SITE_OTHER): Payer: BC Managed Care – PPO | Admitting: Registered Nurse

## 2021-04-20 ENCOUNTER — Other Ambulatory Visit: Payer: Self-pay

## 2021-04-20 DIAGNOSIS — R300 Dysuria: Secondary | ICD-10-CM

## 2021-04-20 MED ORDER — SULFAMETHOXAZOLE-TRIMETHOPRIM 800-160 MG PO TABS: 1.0000 | ORAL_TABLET | Freq: Two times a day (BID) | ORAL | 0 refills | Status: DC

## 2021-04-20 NOTE — Progress Notes (Signed)
Telemedicine Encounter- SOAP NOTE Established Patient  This telephone encounter was conducted with the patient's (or proxy's) verbal consent via audio telecommunications: yes/no: Yes Patient was instructed to have this encounter in a suitably private space; and to only have persons present to whom they give permission to participate. In addition, patient identity was confirmed by use of name plus two identifiers (DOB and address).  I discussed the limitations, risks, security and privacy concerns of performing an evaluation and management service by telephone and the availability of in person appointments. I also discussed with the patient that there may be a patient responsible charge related to this service. The patient expressed understanding and agreed to proceed.  I spent a total of 15 minutes talking with the patient or their proxy.  Patient at home Provider in office  Participants: Jari Sportsman, NP and [name]  Chief Complaint  Patient presents with   Dysuria    Urgency, painful urination    Subjective   Annette Hart is a 20 y.o. established patient. Telephone visit today for dysuria  HPI Onset about 4-5 days ago. Urgency, painful urination Some lower back pain on and off.  No constitutional symptoms. Some nausea, no vomiting or diarrhea. Denies vaginal symptoms -   Patient Active Problem List   Diagnosis Date Noted   Marijuana abuse 01/25/2021   Moderate recurrent major depression (HCC) 11/27/2020   Alcohol abuse 11/27/2020   Physical exam 06/23/2020   Borderline personality disorder (HCC) 03/20/2020   Dysmenorrhea in adolescent 11/10/2016   Anxiety and depression 11/10/2016   GERD (gastroesophageal reflux disease) 11/10/2016   Migraine without aura and without status migrainosus, not intractable 08/12/2016   Allergic reaction 11/07/2013    Past Medical History:  Diagnosis Date   Asthma    Seasonal allergies     Current Outpatient Medications   Medication Sig Dispense Refill   albuterol (VENTOLIN HFA) 108 (90 Base) MCG/ACT inhaler Inhale 1-2 puffs into the lungs every 6 (six) hours as needed for wheezing or shortness of breath.     ferrous sulfate 325 (65 FE) MG tablet Take 325 mg by mouth daily.     fexofenadine (ALLEGRA) 180 MG tablet Take 180 mg by mouth daily.     naproxen (NAPROSYN) 375 MG tablet Take 1 tablet (375 mg total) by mouth 2 (two) times daily with a meal. 30 tablet 0   QUEtiapine (SEROQUEL XR) 50 MG TB24 24 hr tablet Take 1 tab nightly x4 days and then increase to 2 tabs nightly 60 tablet 3   sertraline (ZOLOFT) 100 MG tablet Take 1 tablet (100 mg total) by mouth daily. 90 tablet 1   sulfamethoxazole-trimethoprim (BACTRIM DS) 800-160 MG tablet Take 1 tablet by mouth 2 (two) times daily. 6 tablet 0   QUEtiapine (SEROQUEL) 50 MG tablet Take 0.5-1 tablets (25-50 mg total) by mouth at bedtime. (Patient not taking: Reported on 04/20/2021) 90 tablet 0   No current facility-administered medications for this visit.    Allergies  Allergen Reactions   Justicia Adhatoda Anaphylaxis   Peanut-Containing Drug Products Anaphylaxis   Avocado    Cat Hair Extract    Dog Epithelium    Grass Pollen(K-O-R-T-Swt Vern)    Other     Too much cheese/dairy- GI upset Tree nuts, Environmental Allergies, Feathers, Dogs, Cats    Pollen Extract    Hydrocodone-Acetaminophen Rash    Social History   Socioeconomic History   Marital status: Single    Spouse name: Not on file  Number of children: Not on file   Years of education: Not on file   Highest education level: Not on file  Occupational History   Not on file  Tobacco Use   Smoking status: Some Days    Types: E-cigarettes   Smokeless tobacco: Never  Vaping Use   Vaping Use: Some days  Substance and Sexual Activity   Alcohol use: No   Drug use: Yes    Types: Marijuana    Comment: "once a month"   Sexual activity: Never  Other Topics Concern   Not on file  Social  History Narrative   Currently at [employer]   Social Determinants of Health   Financial Resource Strain: Not on file  Food Insecurity: Not on file  Transportation Needs: Not on file  Physical Activity: Not on file  Stress: Not on file  Social Connections: Not on file  Intimate Partner Violence: Not on file    Review of Systems  Constitutional: Negative.   HENT: Negative.    Eyes: Negative.   Respiratory: Negative.    Cardiovascular: Negative.   Gastrointestinal: Negative.   Genitourinary: Negative.   Musculoskeletal: Negative.   Skin: Negative.   Neurological: Negative.   Endo/Heme/Allergies: Negative.   Psychiatric/Behavioral: Negative.    All other systems reviewed and are negative.  Objective   Vitals as reported by the patient: There were no vitals filed for this visit.  Evelene was seen today for dysuria.  Diagnoses and all orders for this visit:  Dysuria -     sulfamethoxazole-trimethoprim (BACTRIM DS) 800-160 MG tablet; Take 1 tablet by mouth 2 (two) times daily.   PLAN Presume to be UTI. Will treat with bactrim as above If no improvement or worsening, pt will need lab only visit for urinalysis and urine culture. At that time would perform cytology for STI, candida, and BV Discussed nonpharm care and prevention with patient who voiced understanding Patient encouraged to call clinic with any questions, comments, or concerns.  I discussed the assessment and treatment plan with the patient. The patient was provided an opportunity to ask questions and all were answered. The patient agreed with the plan and demonstrated an understanding of the instructions.   The patient was advised to call back or seek an in-person evaluation if the symptoms worsen or if the condition fails to improve as anticipated.  I provided 15 minutes of non-face-to-face time during this encounter.  Janeece Agee, NP

## 2021-04-21 DIAGNOSIS — F603 Borderline personality disorder: Secondary | ICD-10-CM | POA: Diagnosis not present

## 2021-05-04 DIAGNOSIS — J0110 Acute frontal sinusitis, unspecified: Secondary | ICD-10-CM | POA: Diagnosis not present

## 2021-05-19 DIAGNOSIS — F603 Borderline personality disorder: Secondary | ICD-10-CM | POA: Diagnosis not present

## 2021-06-24 ENCOUNTER — Encounter: Payer: BC Managed Care – PPO | Admitting: Family Medicine

## 2021-07-01 DIAGNOSIS — Z113 Encounter for screening for infections with a predominantly sexual mode of transmission: Secondary | ICD-10-CM | POA: Diagnosis not present

## 2021-07-01 DIAGNOSIS — Z114 Encounter for screening for human immunodeficiency virus [HIV]: Secondary | ICD-10-CM | POA: Diagnosis not present

## 2021-07-06 ENCOUNTER — Other Ambulatory Visit: Payer: Self-pay | Admitting: Family Medicine

## 2021-07-06 ENCOUNTER — Other Ambulatory Visit: Payer: Self-pay | Admitting: Registered Nurse

## 2021-07-06 DIAGNOSIS — F419 Anxiety disorder, unspecified: Secondary | ICD-10-CM

## 2021-08-26 ENCOUNTER — Other Ambulatory Visit: Payer: Self-pay | Admitting: Registered Nurse

## 2021-08-26 DIAGNOSIS — F32A Depression, unspecified: Secondary | ICD-10-CM

## 2021-08-26 DIAGNOSIS — F419 Anxiety disorder, unspecified: Secondary | ICD-10-CM

## 2021-09-01 ENCOUNTER — Ambulatory Visit (INDEPENDENT_AMBULATORY_CARE_PROVIDER_SITE_OTHER): Payer: BC Managed Care – PPO

## 2021-09-01 ENCOUNTER — Other Ambulatory Visit: Payer: Self-pay

## 2021-09-01 ENCOUNTER — Ambulatory Visit: Payer: BC Managed Care – PPO | Admitting: Podiatry

## 2021-09-01 DIAGNOSIS — M2142 Flat foot [pes planus] (acquired), left foot: Secondary | ICD-10-CM

## 2021-09-01 DIAGNOSIS — M2141 Flat foot [pes planus] (acquired), right foot: Secondary | ICD-10-CM

## 2021-09-01 DIAGNOSIS — M79672 Pain in left foot: Secondary | ICD-10-CM

## 2021-09-01 NOTE — Progress Notes (Signed)
° °  Subjective:  21 y.o. female otherwise healthy presenting today for evaluation of left foot pain.  Patient states that she is experience left foot pain ever since she was young teenager.  She does have a history of stress reaction fractures.  She says that most recently the left forefoot hurts significantly.  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  She presents for further treatment and evaluation   Past Medical History:  Diagnosis Date   Asthma    Seasonal allergies    Past Surgical History:  Procedure Laterality Date   UMBILICAL HERNIA REPAIR  2003   Allergies  Allergen Reactions   Justicia Adhatoda Anaphylaxis   Peanut-Containing Drug Products Anaphylaxis   Avocado    Cat Hair Extract    Dog Epithelium    Grass Pollen(K-O-R-T-Swt Vern)    Other     Too much cheese/dairy- GI upset Tree nuts, Environmental Allergies, Feathers, Dogs, Cats    Pollen Extract    Hydrocodone-Acetaminophen Rash       Objective/Physical Exam General: The patient is alert and oriented x3 in no acute distress.  Dermatology: Skin is warm, dry and supple bilateral lower extremities. Negative for open lesions or macerations.  Vascular: Palpable pedal pulses bilaterally. No edema or erythema noted. Capillary refill within normal limits.  Neurological: Epicritic and protective threshold grossly intact bilaterally.   Musculoskeletal Exam: Range of motion within normal limits to all pedal and ankle joints bilateral. Muscle strength 5/5 in all groups bilateral.  Upon weightbearing there is a medial longitudinal arch collapse bilaterally. Remove foot valgus noted to the bilateral lower extremities with excessive pronation upon mid stance.  Radiographic Exam:  Normal osseous mineralization. Joint spaces preserved. No fracture/dislocation/boney destruction.   Pes planus noted on radiographic exam lateral views. Decreased calcaneal inclination and metatarsal declination angle is noted.  Anterior break in the cyma line noted on lateral views. Medial talar head to deviation noted on AP radiograph.   Assessment: 1. pes planus bilateral 2.H/o stress reaction fractures left   Plan of Care:  1. Patient was evaluated. X-Rays reviewed.  2.  Appointment with Pedorthist for custom molded orthotics 3.  Recommend good supportive shoes and sneakers advise against going barefoot 4.  Return to clinic as needed   Edrick Kins, DPM [facility]  Dr. Edrick Kins, [address]                                        [place], [place] [address]                Office [phone]  Fax [phone]

## 2021-09-08 ENCOUNTER — Other Ambulatory Visit: Payer: BC Managed Care – PPO

## 2021-10-04 ENCOUNTER — Encounter: Payer: Self-pay | Admitting: Family Medicine

## 2021-10-04 ENCOUNTER — Telehealth (INDEPENDENT_AMBULATORY_CARE_PROVIDER_SITE_OTHER): Payer: BC Managed Care – PPO | Admitting: Family Medicine

## 2021-10-04 DIAGNOSIS — F1210 Cannabis abuse, uncomplicated: Secondary | ICD-10-CM | POA: Diagnosis not present

## 2021-10-04 DIAGNOSIS — F331 Major depressive disorder, recurrent, moderate: Secondary | ICD-10-CM

## 2021-10-04 DIAGNOSIS — F603 Borderline personality disorder: Secondary | ICD-10-CM

## 2021-10-04 MED ORDER — SERTRALINE HCL 25 MG PO TABS
25.0000 mg | ORAL_TABLET | Freq: Every day | ORAL | 1 refills | Status: DC
Start: 2021-10-04 — End: 2022-10-19

## 2021-10-04 NOTE — Assessment & Plan Note (Signed)
Ongoing issue for pt.  She wants FMLA forms filled out to match her current work accommodations.  She also feels she may need a leave of absence if things don't improve.  I asked if we need to adjust her meds to improve her mood but she feels meds are 'working well'.  I pointed out that she just told me that things were terrible and unbearable.  She is open to increasing Sertraline to 125mg  daily.  New prescription for 25mg  sent so she can take in addition to her 100mg  daily.  Will follow closely. ?

## 2021-10-04 NOTE — Progress Notes (Signed)
? ?Virtual Visit via Video  ? ?I connected with patient on 10/04/21 at  8:30 AM EDT by a video enabled telemedicine application and verified that I am speaking with the correct person using two identifiers. ? ?Location patient: Home ?Location provider: [facility], Office ?Persons participating in the virtual visit: Patient, Provider, CMA Tresa Endo C) ? ?I discussed the limitations of evaluation and management by telemedicine and the availability of in person appointments. The patient expressed understanding and agreed to proceed. ? ?Subjective:  ? ?HPI:  ? ?FMLA paperwork- pt reports that she is now eligible for FMLA for her BPD, anxiety, and depression.  Pt currently has accommodations- 2 10 minute breaks daily, up to 2 days/week of 4-8 hr absences.  Is considering a leave in the future if mood doesn't improve.  Pt reports current mood is 'pretty terrible'.  Pt 'relapsed with self harm' last month.  Pt reports taking her medication, going to the gym.  'i dissociate a lot' b/c 'being present mentally is really hard'.  Has stopped drinking.  Continues to smoke weed.  Pt feels current medication is 'working well' despite mood.  Has not been to therapy recently due to $40 copay and she can't afford it. ? ?ROS:  ? ?See pertinent positives and negatives per HPI. ? ?Patient Active Problem List  ? Diagnosis Date Noted  ? Marijuana abuse 01/25/2021  ? 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 Alcohol abuse 11/27/2020  ? Physical exam 06/23/2020  ? Borderline personality disorder (HCC) 03/20/2020  ? Dysmenorrhea in adolescent 11/10/2016  ? Anxiety and depression 11/10/2016  ? GERD (gastroesophageal reflux disease) 11/10/2016  ? Migraine without aura and without status migrainosus, not intractable 08/12/2016  ? Allergic reaction 11/07/2013  ?  ?Social History  ? ?Tobacco Use  ? Smoking status: Some Days  ?  Types: E-cigarettes  ? Smokeless tobacco: Never  ?Substance Use Topics  ? Alcohol use: No   ? ? ?Current Outpatient Medications:  ?  albuterol (VENTOLIN HFA) 108 (90 Base) MCG/ACT inhaler, Inhale 1-2 puffs into the lungs every 6 (six) hours as needed for wheezing or shortness of breath., Disp: , Rfl:  ?  ferrous sulfate 325 (65 FE) MG tablet, Take 325 mg by mouth daily., Disp: , Rfl:  ?  fexofenadine (ALLEGRA) 180 MG tablet, Take 180 mg by mouth daily., Disp: , Rfl:  ?  naproxen (NAPROSYN) 375 MG tablet, Take 1 tablet (375 mg total) by mouth 2 (two) times daily with a meal., Disp: 30 tablet, Rfl: 0 ?  QUEtiapine (SEROQUEL XR) 50 MG TB24 24 hr tablet, Take 1 tab nightly x4 days and then increase to 2 tabs nightly, Disp: 60 tablet, Rfl: 3 ?  QUEtiapine (SEROQUEL) 25 MG tablet, TAKE 1 TABLET(25 MG) BY MOUTH AT BEDTIME, Disp: 90 tablet, Rfl: 0 ?  sertraline (ZOLOFT) 100 MG tablet, TAKE 1 TABLET(100 MG) BY MOUTH DAILY, Disp: 90 tablet, Rfl: 1 ?  QUEtiapine (SEROQUEL) 50 MG tablet, TAKE 1/2 TO 1 TABLET(25 TO 50 MG) BY MOUTH AT BEDTIME (Patient not taking: Reported on 10/04/2021), Disp: 90 tablet, Rfl: 0 ?  sulfamethoxazole-trimethoprim (BACTRIM DS) 800-160 MG tablet, Take 1 tablet by mouth 2 (two) times daily. (Patient not taking: Reported on 10/04/2021), Disp: 6 tablet, Rfl: 0 ? ?Allergies  ?Allergen Reactions  ? Justicia Adhatoda Anaphylaxis  ? Peanut-Containing Drug Products Anaphylaxis  ? Avocado   ? Cat Hair Extract   ? Dog Epithelium   ? Grass Pollen(K-O-R-T-Swt Vern)   ? Other   ?  Too much cheese/dairy- GI upset ?Tree nuts, Environmental Allergies, Feathers, Dogs, Cats ?  ? Pollen Extract   ? Hydrocodone-Acetaminophen Rash  ? ? ?Objective:  ? ?There were no vitals taken for this visit. ? ?AAOx3, NAD ?NCAT, EOMI ?No obvious CN deficits ?Coloring WNL ?Pt is able to speak clearly, coherently without shortness of breath or increased work of breathing.  ?Thought process is linear.  Mood is appropriate.  ? ?Assessment and Plan:  ? ?See problem based charting ? ? ?Neena Rhymes, MD ?10/04/2021 ? ? ?

## 2021-10-04 NOTE — Assessment & Plan Note (Signed)
Ongoing issue for pt.  She's aware that she needs CBT but she is not able to afford it at this time.  I offered to place referral and she declined.  Will continue to follow. ?

## 2021-10-04 NOTE — Assessment & Plan Note (Signed)
Pt continues to smoke regularly.  She states that 'it makes life bearable'.  Discussed that she could use the money she's spending on marijuana to pay for her counseling copay.  She is not ready to make that change right now. ?

## 2021-11-15 ENCOUNTER — Ambulatory Visit: Payer: BC Managed Care – PPO | Admitting: Family Medicine

## 2021-12-23 ENCOUNTER — Other Ambulatory Visit: Payer: Self-pay

## 2021-12-23 DIAGNOSIS — F419 Anxiety disorder, unspecified: Secondary | ICD-10-CM

## 2021-12-23 MED ORDER — QUETIAPINE FUMARATE 25 MG PO TABS: ORAL_TABLET | ORAL | 1 refills | Status: DC

## 2021-12-28 ENCOUNTER — Encounter: Payer: Self-pay | Admitting: Family Medicine

## 2022-01-17 ENCOUNTER — Other Ambulatory Visit: Payer: Self-pay

## 2022-01-17 MED ORDER — SERTRALINE HCL 100 MG PO TABS: ORAL_TABLET | ORAL | 0 refills | Status: DC

## 2022-01-27 ENCOUNTER — Ambulatory Visit (INDEPENDENT_AMBULATORY_CARE_PROVIDER_SITE_OTHER): Payer: BC Managed Care – PPO | Admitting: Family Medicine

## 2022-01-27 ENCOUNTER — Encounter: Payer: Self-pay | Admitting: Family Medicine

## 2022-01-27 VITALS — BP 108/78 | HR 79 | Temp 98.1°F | Resp 16 | Ht 64.5 in | Wt 128.2 lb

## 2022-01-27 DIAGNOSIS — Z23 Encounter for immunization: Secondary | ICD-10-CM | POA: Diagnosis not present

## 2022-01-27 DIAGNOSIS — F331 Major depressive disorder, recurrent, moderate: Secondary | ICD-10-CM

## 2022-01-27 DIAGNOSIS — M21942 Unspecified acquired deformity of hand, left hand: Secondary | ICD-10-CM

## 2022-01-27 DIAGNOSIS — M25562 Pain in left knee: Secondary | ICD-10-CM

## 2022-01-27 DIAGNOSIS — M21941 Unspecified acquired deformity of hand, right hand: Secondary | ICD-10-CM | POA: Diagnosis not present

## 2022-01-27 DIAGNOSIS — G8929 Other chronic pain: Secondary | ICD-10-CM

## 2022-01-27 DIAGNOSIS — Z0000 Encounter for general adult medical examination without abnormal findings: Secondary | ICD-10-CM

## 2022-01-27 MED ORDER — ALBUTEROL SULFATE HFA 108 (90 BASE) MCG/ACT IN AERS: 1.0000 | INHALATION_SPRAY | Freq: Four times a day (QID) | RESPIRATORY_TRACT | 1 refills | Status: AC | PRN

## 2022-01-27 MED ORDER — NAPROXEN 375 MG PO TABS: 375.0000 mg | ORAL_TABLET | Freq: Two times a day (BID) | ORAL | 0 refills | Status: DC

## 2022-01-27 NOTE — Patient Instructions (Signed)
Follow up in 6 months to recheck mood No need for labs today- yay! Keep up the good work on healthy diet and regular exercise- it's important to take care of yourself! Try and quit smoking! If you find your mood slipping- please let someone know! Call with any questions or concerns Hang in there!!

## 2022-01-27 NOTE — Progress Notes (Signed)
   Subjective:    Patient ID: Annette Hart, female    DOB: 2000/07/16, 21 y.o.   MRN: [id number]  HPI CPE- due for Tdap.  Due to start paps at age 23.  Transferring to [facility].  Working at [employer].  Pt reports she is in the process of ending a relationship w/ a married man  Health Maintenance  Topic Date Due   TETANUS/TDAP  01/15/2022   INFLUENZA VACCINE  02/08/2022   HPV VACCINES  Completed   COVID-19 Vaccine  Discontinued   Hepatitis C Screening  Discontinued   HIV Screening  Discontinued      Review of Systems Patient reports no vision/ hearing changes, adenopathy,fever, weight change,  persistant/recurrent hoarseness , swallowing issues, chest pain, palpitations, edema, persistant/recurrent cough, hemoptysis, dyspnea (rest/exertional/paroxysmal nocturnal), gastrointestinal bleeding (melena, rectal bleeding), abdominal pain, significant heartburn, bowel changes, GU symptoms (dysuria, hematuria, incontinence), Gyn symptoms (abnormal  bleeding, pain),  syncope, focal weakness, memory loss, numbness & tingling, skin/hair/nail changes, abnormal bruising or bleeding.   Unable to straighten fingers on both hands.  Reports it takes a lot of effort and strength to open her hands so preferred resting position is hands closed.  Will have severe cramping pain in palms.  L knee pain- pt reports she will hear a clicking and sensation that knee is out of place- which is very painful.  Pt reports kneecap can be out of alignment.    Objective:   Physical Exam General Appearance:    Alert, cooperative, no distress, appears stated age  Head:    Normocephalic, without obvious abnormality, atraumatic  Eyes:    PERRL, conjunctiva/corneas clear, EOM's intact both eyes  Ears:    Normal TM's and external ear canals, both ears  Nose:   Nares normal, septum midline, mucosa normal, no drainage    or sinus tenderness  Throat:   Lips, mucosa, and tongue normal; teeth and gums normal  Neck:   Supple,  symmetrical, trachea midline, no adenopathy;    Thyroid: no enlargement/tenderness/nodules  Back:     Symmetric, no curvature, ROM normal, no CVA tenderness  Lungs:     Clear to auscultation bilaterally, respirations unlabored  Chest Wall:    No tenderness or deformity   Heart:    Regular rate and rhythm, S1 and S2 normal, no murmur, rub   or gallop  Breast Exam:    Deferred to GYN  Abdomen:     Soft, non-tender, bowel sounds active all four quadrants,    no masses, no organomegaly  Genitalia:    Deferred to GYN  Rectal:    Extremities:   Extremities normal, atraumatic, no cyanosis or edema  Pulses:   2+ and symmetric all extremities  Skin:   Skin color, texture, turgor normal, no rashes or lesions  Lymph nodes:   Cervical, supraclavicular, and axillary nodes normal  Neurologic:   CNII-XII intact, normal strength, sensation and reflexes    throughout          Assessment & Plan:

## 2022-01-27 NOTE — Assessment & Plan Note (Signed)
Pt's PE unchanged from previous.  Tdap given.  Due to start paps later this year at age 21.  No need for labs due to lack of risk factors.  Anticipatory guidance provided.

## 2022-01-27 NOTE — Assessment & Plan Note (Signed)
Ongoing issue for pt.  She reports she is taking her Sertraline and Seroquel as directed.  Had to stop therapy due to cost.  Reports she has someone in her life who is a positive influence and is helping her turn things around but this will mean ending her relationship w/ a married man (who is the father of 7 children w/ at least 2 different women).  She says logically she knows she needs to end her relationship but emotionally she is not ready to do so.  Will follow.

## 2022-01-31 ENCOUNTER — Ambulatory Visit: Payer: BC Managed Care – PPO | Admitting: Physician Assistant

## 2022-02-02 ENCOUNTER — Telehealth: Payer: Self-pay | Admitting: Physician Assistant

## 2022-02-02 NOTE — Telephone Encounter (Signed)
Called pt 1X and left vm per pt left mychart message she want an appt with PA Clark for bilateral hand pains.

## 2022-02-17 ENCOUNTER — Other Ambulatory Visit: Payer: Self-pay | Admitting: Registered Nurse

## 2022-02-17 DIAGNOSIS — F32A Depression, unspecified: Secondary | ICD-10-CM

## 2022-02-18 NOTE — Telephone Encounter (Signed)
Patient is requesting a refill of the following medications: Requested Prescriptions   Pending Prescriptions Disp Refills   QUEtiapine (SEROQUEL XR) 50 MG TB24 24 hr tablet [Pharmacy Med Name: QUETIAPINE 50MG  ER TABLETS] 60 tablet 3    Sig: TAKE 1 TABLET BY MOUTH EVERY NIGHT FOR 4 DAYS. INCREASE TO 2 TABLET DAILY THEREAFTER    Date of patient request: 02/17/2022 Last office visit: 01/27/2022 Date of last refill: 03/12/2021 Last refill amount: 60 tablets 3 refills  Follow up time period per chart: n/a

## 2022-02-25 ENCOUNTER — Emergency Department (HOSPITAL_BASED_OUTPATIENT_CLINIC_OR_DEPARTMENT_OTHER)
Admission: EM | Admit: 2022-02-25 | Discharge: 2022-02-26 | Disposition: A | Discharge status: Home / Self Care | Payer: BC Managed Care – PPO | Attending: Emergency Medicine | Admitting: Emergency Medicine

## 2022-02-25 ENCOUNTER — Other Ambulatory Visit: Payer: Self-pay

## 2022-02-25 ENCOUNTER — Encounter (HOSPITAL_BASED_OUTPATIENT_CLINIC_OR_DEPARTMENT_OTHER): Payer: Self-pay | Admitting: Emergency Medicine

## 2022-02-25 DIAGNOSIS — Z91010 Allergy to peanuts: Secondary | ICD-10-CM | POA: Diagnosis not present

## 2022-02-25 DIAGNOSIS — M542 Cervicalgia: Secondary | ICD-10-CM | POA: Insufficient documentation

## 2022-02-25 DIAGNOSIS — M436 Torticollis: Secondary | ICD-10-CM

## 2022-02-25 MED ORDER — ACETAMINOPHEN 500 MG PO TABS
1000.0000 mg | ORAL_TABLET | Freq: Once | ORAL | Status: AC
  Administered 2022-02-26: 1000 mg via ORAL
  Filled 2022-02-25: qty 2

## 2022-02-25 MED ORDER — KETOROLAC TROMETHAMINE 60 MG/2ML IM SOLN
30.0000 mg | Freq: Once | INTRAMUSCULAR | Status: AC
  Administered 2022-02-26: 30 mg via INTRAMUSCULAR
  Filled 2022-02-25: qty 2

## 2022-02-25 MED ORDER — METHOCARBAMOL 500 MG PO TABS
1000.0000 mg | ORAL_TABLET | Freq: Once | ORAL | Status: AC
  Administered 2022-02-26: 1000 mg via ORAL
  Filled 2022-02-25: qty 2

## 2022-02-25 NOTE — ED Triage Notes (Signed)
Pt woke up Monday morning with stiff neck and unable to bend neck. Pt states pain is getting worse and now into both shoulders. Nothing has helped pain. Pt also complains of nausea that started at the same time. Denies any injury. OTC medications are not helping at all.

## 2022-02-26 MED ORDER — METHOCARBAMOL 500 MG PO TABS: 500.0000 mg | ORAL_TABLET | Freq: Two times a day (BID) | ORAL | 0 refills | Status: DC

## 2022-02-26 MED ORDER — NAPROXEN 500 MG PO TABS
500.0000 mg | ORAL_TABLET | Freq: Two times a day (BID) | ORAL | 0 refills | Status: DC
Start: 2022-02-26 — End: 2022-04-18

## 2022-02-26 NOTE — ED Provider Notes (Signed)
[facility] EMERGENCY DEPARTMENT Provider Note   CSN: [id number] Arrival date & time: 02/25/22  2159     History  Chief Complaint  Patient presents with   Torticollis   Nausea    Annette Hart is a 21 y.o. female.  The history is provided by the patient.  Illness Location:  B neck Quality:  Sore and stiff Severity:  Moderate Onset quality:  Sudden Duration:  5 days Timing:  Constant Progression:  Unchanged Chronicity:  New Context:  Awoke sore and stiff on Monday morning. Relieved by:  Nothing Worsened by:  Movement Ineffective treatments:  A few doses of ibuprofen most recently 2 days ago Associated symptoms: no congestion, no fever, no rash, no sore throat and no wheezing   Risk factors:  None.  No camping, no travel.  No tick exposure (states she does not go outside).  No fevers, no rash      Home Medications Prior to Admission medications   Medication Sig Start Date End Date Taking? Authorizing Provider  albuterol (VENTOLIN HFA) 108 (90 Base) MCG/ACT inhaler Inhale 1-2 puffs into the lungs every 6 (six) hours as needed for wheezing or shortness of breath. 01/27/22   Sheliah Hatch, MD  ferrous sulfate 325 (65 FE) MG tablet Take 325 mg by mouth daily.    [provider]  fexofenadine (ALLEGRA) 180 MG tablet Take 180 mg by mouth daily.    [provider]  naproxen (NAPROSYN) 375 MG tablet Take 1 tablet (375 mg total) by mouth 2 (two) times daily with a meal. 01/27/22   Sheliah Hatch, MD  QUEtiapine (SEROQUEL XR) 50 MG TB24 24 hr tablet TAKE 1 TABLET BY MOUTH EVERY NIGHT FOR 4 DAYS. INCREASE TO 2 TABLET DAILY THEREAFTER 02/18/22   Sheliah Hatch, MD  QUEtiapine (SEROQUEL) 25 MG tablet TAKE 1 TABLET(25 MG) BY MOUTH AT BEDTIME 12/23/21   Sheliah Hatch, MD  sertraline (ZOLOFT) 100 MG tablet TAKE 1 TABLET(100 MG) BY MOUTH DAILY 01/17/22   Sheliah Hatch, MD  sertraline (ZOLOFT) 25 MG tablet Take 1 tablet (25 mg total) by  mouth daily. Take in addition to 100mg  tab for total of 125mg  daily 10/04/21   , MD      Allergies    10/06/21, Peanut-containing drug products, Avocado, Cat hair extract, Dog epithelium, Grass pollen(k-o-r-t-swt vern), Other, Pollen extract, and Hydrocodone-acetaminophen    Review of Systems   Review of Systems  Constitutional:  Negative for fever.  HENT:  Negative for congestion, drooling and sore throat.   Eyes:  Negative for photophobia and redness.  Respiratory:  Negative for wheezing and stridor.   Musculoskeletal:  Positive for neck pain.  Skin:  Negative for rash.  Neurological:  Negative for weakness.  All other systems reviewed and are negative.   Physical Exam Updated Vital Signs BP 92/69 (BP Location: Left Arm)   Pulse (!) 102   Temp 98.4 F (36.9 C) (Oral)   Resp 18   Ht 5\' 4"  (1.626 m)   Wt 58.2 kg   LMP 02/11/2022 (Exact Date)   SpO2 100%   BMI 22.02 kg/m  Physical Exam Vitals and nursing note reviewed. Exam conducted with a chaperone present.  Constitutional:      General: She is not in acute distress.    Appearance: Normal appearance. She is well-developed.     Comments: Sitting in room comfortably with lights on   HENT:     Head: Normocephalic  and atraumatic.     Nose: Nose normal.  Eyes:     Pupils: Pupils are equal, round, and reactive to light.  Neck:     Comments: She is moving the neck actively in all directions without hesitation.  No midline tenderness  Cardiovascular:     Rate and Rhythm: Normal rate and regular rhythm.     Pulses: Normal pulses.     Heart sounds: Normal heart sounds.  Pulmonary:     Effort: Pulmonary effort is normal. No respiratory distress.     Breath sounds: Normal breath sounds.  Abdominal:     General: Bowel sounds are normal. There is no distension.     Palpations: Abdomen is soft.     Tenderness: There is no abdominal tenderness. There is no guarding or rebound.  Genitourinary:     Vagina: No vaginal discharge.  Musculoskeletal:        General: Normal range of motion.     Cervical back: Normal range of motion and neck supple. No rigidity.  Lymphadenopathy:     Cervical: No cervical adenopathy.  Skin:    General: Skin is warm and dry.     Capillary Refill: Capillary refill takes less than 2 seconds.     Findings: No erythema or rash.  Neurological:     General: No focal deficit present.     Mental Status: She is alert and oriented to person, place, and time.     Deep Tendon Reflexes: Reflexes normal.  Psychiatric:        Mood and Affect: Mood normal.     ED Results / Procedures / Treatments   Labs (all labs ordered are listed, but only abnormal results are displayed) Labs Reviewed  PREGNANCY, URINE    EKG None  Radiology No results found.  Procedures Procedures    Medications Ordered in ED Medications  ketorolac (TORADOL) injection 30 mg (30 mg Intramuscular Given 02/26/22 0003)  methocarbamol (ROBAXIN) tablet 1,000 mg (1,000 mg Oral Given 02/26/22 0004)  acetaminophen (TYLENOL) tablet 1,000 mg (1,000 mg Oral Given 02/26/22 0004)    ED Course/ Medical Decision Making/ A&P                           Medical Decision Making Neck pain since Monday am  Amount and/or Complexity of Data Reviewed Independent Historian: parent    Details: see above External Data Reviewed: notes.    Details: previous notes reviewed Labs: ordered.    Details: negative pregnancy test  Risk OTC drugs. Prescription drug management. Risk Details: Heat therapy with NSAIDs alternating with tylenol and muscle relaxants.  No indication for imaging.  No trauma.  I do not believe this is meningitis.  No HA, no fever, no sick contacts.  Follow up with your PMD    Final Clinical Impression(s) / ED Diagnoses Final diagnoses:  None  Return for intractable cough, coughing up blood, fevers > 100.4 unrelieved by medication, shortness of breath, intractable vomiting, chest  pain, shortness of breath, weakness, numbness, changes in speech, facial asymmetry, abdominal pain, passing out, Inability to tolerate liquids or food, cough, altered mental status or any concerns. No signs of systemic illness or infection. The patient is nontoxic-appearing on exam and vital signs are within normal limits.  I have reviewed the triage vital signs and the nursing notes. Pertinent labs & imaging results that were available during my care of the patient were reviewed by me and considered in my  medical decision making (see chart for details). After history, exam, and medical workup I feel the patient has been appropriately medically screened and is safe for discharge home. Pertinent diagnoses were discussed with the patient. Patient was given return precautions.   Rx / DC Orders ED Discharge Orders     None         Shanan Mcmiller, MD 02/26/22 [phone]

## 2022-04-07 ENCOUNTER — Other Ambulatory Visit: Payer: Self-pay

## 2022-04-07 ENCOUNTER — Emergency Department (HOSPITAL_COMMUNITY)
Admission: EM | Admit: 2022-04-07 | Discharge: 2022-04-07 | Discharge status: Against Medical Advice | Payer: BC Managed Care – PPO | Attending: Emergency Medicine | Admitting: Emergency Medicine

## 2022-04-07 ENCOUNTER — Encounter (HOSPITAL_COMMUNITY): Payer: Self-pay

## 2022-04-07 DIAGNOSIS — Z5321 Procedure and treatment not carried out due to patient leaving prior to being seen by health care provider: Secondary | ICD-10-CM | POA: Diagnosis not present

## 2022-04-07 DIAGNOSIS — R413 Other amnesia: Secondary | ICD-10-CM | POA: Diagnosis not present

## 2022-04-07 DIAGNOSIS — R110 Nausea: Secondary | ICD-10-CM | POA: Diagnosis not present

## 2022-04-07 DIAGNOSIS — R519 Headache, unspecified: Secondary | ICD-10-CM | POA: Diagnosis not present

## 2022-04-07 DIAGNOSIS — S0990XA Unspecified injury of head, initial encounter: Secondary | ICD-10-CM | POA: Diagnosis not present

## 2022-04-07 NOTE — ED Provider Triage Note (Signed)
Emergency Medicine Provider Triage Evaluation Note  Jonathan Kirkendoll , a 21 y.o. female  was evaluated in triage.  Pt complains of head injury.  Injury occurred yesterday where she hit the right upper side of her forehead against the door.  She did not pass out or have any memory loss of the event.  Complains of a continued headache associated nausea.  She says she had a concussion in the past feels similar to this.  She denies any visual changes, numbness, weakness, gait abnormalities, vomiting.  Review of Systems  Positive:  Negative:   Physical Exam  BP 119/73 (BP Location: Right Arm)   Pulse 89   Temp 99.2 F (37.3 C) (Oral)   Resp 16   Ht 5\' 4"  (1.626 m)   Wt 58.1 kg   SpO2 98%   BMI 21.97 kg/m  Gen:   Awake, no distress   Resp:  Normal effort  MSK:   Moves extremities without difficulty  Other:  Head atraumatic. PERRLA. EOM intact. 5/5 motor strength in all ext. Alert and oriented x 3. No battle sign. No raccoon eyes. No drainage noted from ears or nose.   Medical Decision Making  Medically screening exam initiated at 12:28 PM.  Appropriate orders placed.  Eddie Koc was informed that the remainder of the evaluation will be completed by another provider, this initial triage assessment does not replace that evaluation, and the importance of remaining in the ED until their evaluation is complete.  Do not feel head CT is indicated at this time as it has been over 24 hours since the incident. Will continue to monitor while patient is in waiting room.   Adolphus Birchwood, PA-C 04/07/22 1230

## 2022-04-07 NOTE — ED Triage Notes (Signed)
Reports hit her head on her door frame and since has been having headahe and memory loss nausea Reports no vomiting and this is not her first concussion.  No lac or LOC.  Did not take tylenol or motrin bc she takes rx meds

## 2022-04-07 NOTE — ED Notes (Signed)
PATIENT LEFT AMA 

## 2022-04-09 ENCOUNTER — Encounter (HOSPITAL_BASED_OUTPATIENT_CLINIC_OR_DEPARTMENT_OTHER): Payer: Self-pay | Admitting: Emergency Medicine

## 2022-04-09 ENCOUNTER — Other Ambulatory Visit: Payer: Self-pay

## 2022-04-09 ENCOUNTER — Emergency Department (HOSPITAL_BASED_OUTPATIENT_CLINIC_OR_DEPARTMENT_OTHER)
Admission: EM | Admit: 2022-04-09 | Discharge: 2022-04-09 | Disposition: A | Discharge status: Home / Self Care | Payer: BC Managed Care – PPO | Attending: Emergency Medicine | Admitting: Emergency Medicine

## 2022-04-09 ENCOUNTER — Emergency Department (HOSPITAL_BASED_OUTPATIENT_CLINIC_OR_DEPARTMENT_OTHER): Payer: BC Managed Care – PPO

## 2022-04-09 ENCOUNTER — Ambulatory Visit: Admit: 2022-04-09 | Payer: BC Managed Care – PPO

## 2022-04-09 DIAGNOSIS — M542 Cervicalgia: Secondary | ICD-10-CM | POA: Diagnosis not present

## 2022-04-09 DIAGNOSIS — Z91010 Allergy to peanuts: Secondary | ICD-10-CM | POA: Insufficient documentation

## 2022-04-09 DIAGNOSIS — S060X0A Concussion without loss of consciousness, initial encounter: Secondary | ICD-10-CM | POA: Diagnosis not present

## 2022-04-09 DIAGNOSIS — S0990XA Unspecified injury of head, initial encounter: Secondary | ICD-10-CM | POA: Diagnosis not present

## 2022-04-09 DIAGNOSIS — S199XXA Unspecified injury of neck, initial encounter: Secondary | ICD-10-CM | POA: Diagnosis not present

## 2022-04-09 DIAGNOSIS — S0993XA Unspecified injury of face, initial encounter: Secondary | ICD-10-CM | POA: Diagnosis not present

## 2022-04-09 DIAGNOSIS — W19XXXA Unspecified fall, initial encounter: Secondary | ICD-10-CM | POA: Insufficient documentation

## 2022-04-09 DIAGNOSIS — S098XXA Other specified injuries of head, initial encounter: Secondary | ICD-10-CM | POA: Diagnosis not present

## 2022-04-09 LAB — PREGNANCY, URINE: Preg Test, Ur: NEGATIVE

## 2022-04-09 MED ORDER — SODIUM CHLORIDE 0.9 % IV BOLUS
1000.0000 mL | Freq: Once | INTRAVENOUS | Status: AC
  Administered 2022-04-09: 1000 mL via INTRAVENOUS

## 2022-04-09 MED ORDER — ONDANSETRON HCL 4 MG PO TABS: 4.0000 mg | ORAL_TABLET | Freq: Four times a day (QID) | ORAL | 0 refills | Status: DC

## 2022-04-09 MED ORDER — DIPHENHYDRAMINE HCL 50 MG/ML IJ SOLN
25.0000 mg | Freq: Once | INTRAMUSCULAR | Status: AC
  Administered 2022-04-09: 25 mg via INTRAVENOUS
  Filled 2022-04-09: qty 1

## 2022-04-09 MED ORDER — METOCLOPRAMIDE HCL 5 MG/ML IJ SOLN
10.0000 mg | Freq: Once | INTRAMUSCULAR | Status: AC
  Administered 2022-04-09: 10 mg via INTRAVENOUS
  Filled 2022-04-09: qty 2

## 2022-04-09 MED ORDER — DEXAMETHASONE SODIUM PHOSPHATE 10 MG/ML IJ SOLN
10.0000 mg | Freq: Once | INTRAMUSCULAR | Status: AC
  Administered 2022-04-09: 10 mg via INTRAVENOUS
  Filled 2022-04-09: qty 1

## 2022-04-09 NOTE — ED Provider Notes (Signed)
[facility] DEPT Provider Note   CSN: [id number] Arrival date & time: 04/09/22  1225     History  Chief Complaint  Patient presents with   Fall   Head Injury    Annette Hart is a 21 y.o. female, who presents to the ED secondary to head trauma that occurred around 4 days ago.  She states that since he had her right side of her head on the car door several days ago, she has had neck pain, right headache, and temple pain.  She does have some nausea, no vomiting.  Mother states that she has been more slow to respond, and sleepy.  No blood thinners, did not lose consciousness during head injury.  She denies any confusion.  Has not had any vision changes.  Does complain of some right-sided neck pain, and took Tylenol without relief yesterday.  Mother states that lately would like a CT scan as they are concerned she has a head bleed.  Fall Associated symptoms include headaches.  Head Injury Associated symptoms: headache and nausea   Associated symptoms: no numbness and no vomiting    Fall Associated symptoms include headaches.  Head Injury Associated symptoms: headache and nausea   Associated symptoms: no numbness and no vomiting    Fall Associated symptoms include headaches.  Head Injury Associated symptoms: headache and nausea   Associated symptoms: no numbness    Fall Associated symptoms include headaches.  Head Injury Associated symptoms: headache   Associated symptoms: no numbness    Fall Associated symptoms include headaches.  Head Injury Associated symptoms: headache        Home Medications Prior to Admission medications   Medication Sig Start Date End Date Taking? Authorizing Provider  ondansetron (ZOFRAN) 4 MG tablet Take 1 tablet (4 mg total) by mouth every 6 (six) hours. 04/09/22  Yes Roda Lauture L, PA  albuterol (VENTOLIN HFA) 108 (90 Base) MCG/ACT inhaler Inhale 1-2 puffs into the lungs every 6 (six) hours as needed for  wheezing or shortness of breath. 01/27/22   Midge Minium, MD  ferrous sulfate 325 (65 FE) MG tablet Take 325 mg by mouth daily.    [provider]  fexofenadine (ALLEGRA) 180 MG tablet Take 180 mg by mouth daily.    [provider]  methocarbamol (ROBAXIN) 500 MG tablet Take 1 tablet (500 mg total) by mouth 2 (two) times daily. 02/26/22   Palumbo, April, MD  naproxen (NAPROSYN) 375 MG tablet Take 1 tablet (375 mg total) by mouth 2 (two) times daily with a meal. 01/27/22   Midge Minium, MD  naproxen (NAPROSYN) 500 MG tablet Take 1 tablet (500 mg total) by mouth 2 (two) times daily with a meal. 02/26/22   Palumbo, April, MD  QUEtiapine (SEROQUEL XR) 50 MG TB24 24 hr tablet TAKE 1 TABLET BY MOUTH EVERY NIGHT FOR 4 DAYS. INCREASE TO 2 TABLET DAILY THEREAFTER 02/18/22   Midge Minium, MD  QUEtiapine (SEROQUEL) 25 MG tablet TAKE 1 TABLET(25 MG) BY MOUTH AT BEDTIME 12/23/21   Midge Minium, MD  sertraline (ZOLOFT) 100 MG tablet TAKE 1 TABLET(100 MG) BY MOUTH DAILY 01/17/22   Midge Minium, MD  sertraline (ZOLOFT) 25 MG tablet Take 1 tablet (25 mg total) by mouth daily. Take in addition to 100mg  tab for total of 125mg  daily 10/04/21   Midge Minium, MD      Allergies    Lenon Ahmadi, Peanut-containing drug products, Avocado, Cat hair extract, Dog epithelium,  Grass pollen(k-o-r-t-swt vern), Other, Pollen extract, and Hydrocodone-acetaminophen    Review of Systems   Review of Systems  Gastrointestinal:  Positive for nausea. Negative for vomiting.  Neurological:  Positive for dizziness and headaches. Negative for weakness and numbness.    Physical Exam Updated Vital Signs BP 112/64 (BP Location: Right Arm)   Pulse 70   Temp 98.5 F (36.9 C) (Oral)   Resp 16   SpO2 100%  Physical Exam Vitals and nursing note reviewed.  Constitutional:      General: She is not in acute distress.    Appearance: She is well-developed.  HENT:     Head:  Normocephalic and atraumatic.     Comments: TTP of R temple and infraorbital, no stepoffs or crepitus. Generalized ttp of R scalp    Right Ear: Tympanic membrane normal.     Left Ear: Tympanic membrane normal.     Nose: Nose normal.     Right Nostril: No septal hematoma.     Left Nostril: No septal hematoma.     Mouth/Throat:     Mouth: Mucous membranes are moist.  Eyes:     General: Vision grossly intact.     Extraocular Movements: Extraocular movements intact.     Conjunctiva/sclera: Conjunctivae normal.  Neck:     Comments: +TTP of C-spine Cardiovascular:     Rate and Rhythm: Normal rate and regular rhythm.     Heart sounds: No murmur heard. Pulmonary:     Effort: Pulmonary effort is normal. No respiratory distress.     Breath sounds: Normal breath sounds.  Abdominal:     Palpations: Abdomen is soft.     Tenderness: There is no abdominal tenderness.  Musculoskeletal:        General: No swelling.     Cervical back: Neck supple.  Skin:    General: Skin is warm and dry.     Capillary Refill: Capillary refill takes less than 2 seconds.  Neurological:     General: No focal deficit present.     Mental Status: She is alert.     Cranial Nerves: Cranial nerves 2-12 are intact.     Sensory: Sensation is intact.     Motor: Motor function is intact.     Coordination: Coordination is intact.  Psychiatric:        Mood and Affect: Mood normal.     ED Results / Procedures / Treatments   Labs (all labs ordered are listed, but only abnormal results are displayed) Labs Reviewed  PREGNANCY, URINE    EKG None  Radiology CT Head Wo Contrast  Result Date: 04/09/2022 CLINICAL DATA:  Facial trauma, hit head on door frame Wednesday, difficulty concentrating EXAM: CT HEAD WITHOUT CONTRAST CT MAXILLOFACIAL WITHOUT CONTRAST CT CERVICAL SPINE WITHOUT CONTRAST TECHNIQUE: Multidetector CT imaging of the head, cervical spine, and maxillofacial structures were performed using the standard  protocol without intravenous contrast. Multiplanar CT image reconstructions of the cervical spine and maxillofacial structures were also generated. RADIATION DOSE REDUCTION: This exam was performed according to the departmental dose-optimization program which includes automated exposure control, adjustment of the mA and/or kV according to patient size and/or use of iterative reconstruction technique. COMPARISON:  07/06/2016 FINDINGS: CT HEAD FINDINGS Brain: No evidence of acute infarction, hemorrhage, hydrocephalus, extra-axial collection or mass lesion/mass effect. Vascular: No hyperdense vessel or unexpected calcification. CT FACIAL BONES FINDINGS Skull: Normal. Negative for fracture or focal lesion. Facial bones: No displaced fractures or dislocations. Sinuses/Orbits: No acute finding. Other: None. CT  CERVICAL SPINE FINDINGS Alignment: Normal. Skull base and vertebrae: No acute fracture. No primary bone lesion or focal pathologic process. Soft tissues and spinal canal: No prevertebral fluid or swelling. No visible canal hematoma. Disc levels:  Intact. Upper chest: Negative. Other: None. IMPRESSION: 1. No acute intracranial pathology. 2. No displaced fractures or dislocations of the facial bones. 3. No fracture or static subluxation of the cervical spine. Electronically Signed   By: Jearld Lesch M.D.   On: 04/09/2022 15:55   CT Cervical Spine Wo Contrast  Result Date: 04/09/2022 CLINICAL DATA:  Facial trauma, hit head on door frame Wednesday, difficulty concentrating EXAM: CT HEAD WITHOUT CONTRAST CT MAXILLOFACIAL WITHOUT CONTRAST CT CERVICAL SPINE WITHOUT CONTRAST TECHNIQUE: Multidetector CT imaging of the head, cervical spine, and maxillofacial structures were performed using the standard protocol without intravenous contrast. Multiplanar CT image reconstructions of the cervical spine and maxillofacial structures were also generated. RADIATION DOSE REDUCTION: This exam was performed according to the  departmental dose-optimization program which includes automated exposure control, adjustment of the mA and/or kV according to patient size and/or use of iterative reconstruction technique. COMPARISON:  07/06/2016 FINDINGS: CT HEAD FINDINGS Brain: No evidence of acute infarction, hemorrhage, hydrocephalus, extra-axial collection or mass lesion/mass effect. Vascular: No hyperdense vessel or unexpected calcification. CT FACIAL BONES FINDINGS Skull: Normal. Negative for fracture or focal lesion. Facial bones: No displaced fractures or dislocations. Sinuses/Orbits: No acute finding. Other: None. CT CERVICAL SPINE FINDINGS Alignment: Normal. Skull base and vertebrae: No acute fracture. No primary bone lesion or focal pathologic process. Soft tissues and spinal canal: No prevertebral fluid or swelling. No visible canal hematoma. Disc levels:  Intact. Upper chest: Negative. Other: None. IMPRESSION: 1. No acute intracranial pathology. 2. No displaced fractures or dislocations of the facial bones. 3. No fracture or static subluxation of the cervical spine. Electronically Signed   By: Jearld Lesch M.D.   On: 04/09/2022 15:55   CT Maxillofacial Wo Contrast  Result Date: 04/09/2022 CLINICAL DATA:  Facial trauma, hit head on door frame Wednesday, difficulty concentrating EXAM: CT HEAD WITHOUT CONTRAST CT MAXILLOFACIAL WITHOUT CONTRAST CT CERVICAL SPINE WITHOUT CONTRAST TECHNIQUE: Multidetector CT imaging of the head, cervical spine, and maxillofacial structures were performed using the standard protocol without intravenous contrast. Multiplanar CT image reconstructions of the cervical spine and maxillofacial structures were also generated. RADIATION DOSE REDUCTION: This exam was performed according to the departmental dose-optimization program which includes automated exposure control, adjustment of the mA and/or kV according to patient size and/or use of iterative reconstruction technique. COMPARISON:  07/06/2016 FINDINGS:  CT HEAD FINDINGS Brain: No evidence of acute infarction, hemorrhage, hydrocephalus, extra-axial collection or mass lesion/mass effect. Vascular: No hyperdense vessel or unexpected calcification. CT FACIAL BONES FINDINGS Skull: Normal. Negative for fracture or focal lesion. Facial bones: No displaced fractures or dislocations. Sinuses/Orbits: No acute finding. Other: None. CT CERVICAL SPINE FINDINGS Alignment: Normal. Skull base and vertebrae: No acute fracture. No primary bone lesion or focal pathologic process. Soft tissues and spinal canal: No prevertebral fluid or swelling. No visible canal hematoma. Disc levels:  Intact. Upper chest: Negative. Other: None. IMPRESSION: 1. No acute intracranial pathology. 2. No displaced fractures or dislocations of the facial bones. 3. No fracture or static subluxation of the cervical spine. Electronically Signed   By: Jearld Lesch M.D.   On: 04/09/2022 15:55    Procedures Procedures   Medications Ordered in ED Medications  dexamethasone (DECADRON) injection 10 mg (10 mg Intravenous Given 04/09/22 1628)  metoCLOPramide (REGLAN) injection  10 mg (10 mg Intravenous Given 04/09/22 1628)  diphenhydrAMINE (BENADRYL) injection 25 mg (25 mg Intravenous Given 04/09/22 1627)  sodium chloride 0.9 % bolus 1,000 mL (1,000 mLs Intravenous New Bag/Given 04/09/22 1638)    ED Course/ Medical Decision Making/ A&P                           Medical Decision Making Amount and/or Complexity of Data Reviewed Labs: ordered. Radiology: ordered.  Risk Prescription drug management.    This patient presents to the ED for concern of headache, nausea, R temple pain, Neck pain  Additional history obtained:  Additional history obtained from mother   Imaging Studies ordered:  I ordered imaging studies including  CT head/neck/maxillofacial, no acute findings   Problem List / ED Course / Critical interventions / Medication management  I ordered medication including reglan,  benadryl, and decadron   for headache  Reevaluation of the patient after these medicines showed that the patient improved I have reviewed the patients home medicines and have made adjustments as needed   Test / Admission - Considered:  Patient is a 21 year old female, here for head trauma, right-sided head pain, difficulty concentrating, headache, neck pain.  Discussed likely concussion secondary to head trauma, and there is no loss of consciousness or blood thinners.  Recommended no CT, family and patient would like CT, discussed risk of radiation, they understood.  CT max face ordered secondary to rule out any orbital fracture, causing this severe pain, additionally ordered C-spine secondary to neck pain and midline tenderness.  Head CT, max face, C-spine all within normal limits.  Improved with Reglan, Benadryl, Decadron.  No acute neuro findings.  Discussed with mother, treatment for headache for home, Zofran sent.  Discussed return precautions, voiced understanding.  Final Clinical Impression(s) / ED Diagnoses Final diagnoses:  Head injury  Concussion without loss of consciousness, initial encounter  Neck pain    Rx / DC Orders ED Discharge Orders          Ordered    ondansetron (ZOFRAN) 4 MG tablet  Every 6 hours        04/09/22 1734              Ahijah Devery, [place], [place] 04/09/22 1837    Ernie Avena, MD 04/10/22 [id number]

## 2022-04-09 NOTE — ED Triage Notes (Signed)
Pt reports hitting her head on door frame, since them has hard time concentrating, headache, this happened Wednesday.

## 2022-04-09 NOTE — ED Triage Notes (Signed)
Has had 2 concussions about 3 years ago fell oob,and 2022 hit head on floor in living room.

## 2022-04-09 NOTE — Discharge Instructions (Addendum)
Please follow-up with your primary care provider.  If you have any severe nausea, vomiting unable to keep anything down please return to the ER.  If you become confused please return to the ER.

## 2022-04-18 ENCOUNTER — Encounter: Payer: Self-pay | Admitting: Family Medicine

## 2022-04-18 ENCOUNTER — Ambulatory Visit (INDEPENDENT_AMBULATORY_CARE_PROVIDER_SITE_OTHER): Payer: BC Managed Care – PPO | Admitting: Family Medicine

## 2022-04-18 VITALS — BP 100/60 | HR 102 | Temp 98.6°F | Resp 16 | Ht 64.0 in | Wt 136.2 lb

## 2022-04-18 DIAGNOSIS — F331 Major depressive disorder, recurrent, moderate: Secondary | ICD-10-CM

## 2022-04-18 DIAGNOSIS — S060X0A Concussion without loss of consciousness, initial encounter: Secondary | ICD-10-CM | POA: Diagnosis not present

## 2022-04-18 NOTE — Patient Instructions (Signed)
Follow up as needed or as scheduled We'll call you to schedule your neurology appt Tylenol/ibuprofen as needed for headache Drink LOTS of water REST!!!  Allow yourself time to recover! Each concussion can take longer to recover from- so give yourself time! Call with any questions or concerns Hang in there!!

## 2022-04-18 NOTE — Progress Notes (Signed)
   Subjective:    Patient ID: Annette Hart, female    DOB: 08-05-2000, 21 y.o.   MRN: [id number]  HPI ER f/u- pt was seen in ER on 9/30 after hitting her head on the car door 4 days prior.  She was suffering w/ R sided head pain, difficulty concentrating, HA, neck pain.  She was dx'd w/ concussion and EDP recommended against CT due to no loss of consciousness and no use of blood thinners.  Pt and family wanted CT- both head and maxillofacial CT WNL.  No acute neuro findings on exam.  Was given decadron, benadryl, Zofran and a work note for 1 week.  Today pt complains of exhaustion.  Not sleeping well due to bizarre dreams.  Having memory issues, 'i feel like my head is not connected to my body'.  This is pt's 3rd concussion in last 2-3 yrs.  Continues to have issues w/ light and sound- which made driving here difficult.  Also makes returning to work difficult- bright lights, loud noise.  Pt reports being extremely depressed despite Sertraline 125mg  daily and Seroquel.     Review of Systems For ROS see HPI     Objective:   Physical Exam Vitals reviewed.  Constitutional:      General: She is not in acute distress.    Appearance: Normal appearance. She is not ill-appearing.  HENT:     Head: Normocephalic and atraumatic.  Eyes:     Extraocular Movements: Extraocular movements intact.     Conjunctiva/sclera: Conjunctivae normal.     Pupils: Pupils are equal, round, and reactive to light.  Musculoskeletal:     Cervical back: Normal range of motion and neck supple. No rigidity.  Skin:    General: Skin is warm and dry.  Neurological:     General: No focal deficit present.     Mental Status: She is alert and oriented to person, place, and time.     Cranial Nerves: No cranial nerve deficit.     Motor: No weakness.     Gait: Gait normal.  Psychiatric:        Mood and Affect: Mood normal.        Behavior: Behavior normal.     Comments: Tangential thought process            Assessment & Plan:   Concussion- new.  This is pt's 3rd concussion in the last 2-3 yrs.  Discussed that each subsequent head injury can take longer to recover from.  She is still having issues w/ light and sound sensitivity which makes returning to work impossible given the environment.  Given that pt is still symptomatic, will advise to remain out of work this week and can return next week as long as she is able to work from home.  Will fill out work accommodation paperwork once pt provides.  Given the risk of post-concussion syndrome, will refer to Neuro for complete evaluation.  Pt expressed understanding and is in agreement w/ plan.   Depression- deteriorated.  This may be concussion related.  Since she was previously doing well on the Sertraline and Seroquel, will not make any med changes at this time but will follow.

## 2022-04-22 ENCOUNTER — Encounter: Payer: Self-pay | Admitting: Family Medicine

## 2022-04-26 ENCOUNTER — Encounter: Payer: Self-pay | Admitting: Family Medicine

## 2022-04-26 ENCOUNTER — Telehealth: Payer: Self-pay

## 2022-04-26 DIAGNOSIS — Z0279 Encounter for issue of other medical certificate: Secondary | ICD-10-CM

## 2022-04-26 NOTE — Telephone Encounter (Signed)
Return to work ,  and FMLA form placed in Dr Birdie Riddle to be signed folder . Once signed we will fax back to [phone] and then notify pt as well . Charge sheet attached

## 2022-04-27 ENCOUNTER — Encounter: Payer: Self-pay | Admitting: Psychiatry

## 2022-04-27 ENCOUNTER — Ambulatory Visit: Payer: BC Managed Care – PPO | Admitting: Psychiatry

## 2022-04-27 NOTE — Progress Notes (Deleted)
[facility]  PATIENT: Annette Hart DOB: August 11, 2000  REFERRING CLINICIAN: Sheliah Hatch, MD HISTORY FROM: *** REASON FOR VISIT: post-concussive syndrome   HISTORICAL  CHIEF COMPLAINT:  No chief complaint on file.   HISTORY OF PRESENT ILLNESS:  The patient presents for evaluation of post-concussive symptoms following a head trauma. On 04/05/22 she hit the right side of her head on a car door. She presented ot the ED 04/09/22 where [facility], C-spine, and maxillofacial were unremarkable.  Since the accident she has developed mood issues, insomnia, difficulty with memory and concentration, photophobia, and phonophobia.  OTHER MEDICAL CONDITIONS: depression   REVIEW OF SYSTEMS: Full 14 system review of systems performed and negative with exception of: ***  ALLERGIES: Allergies  Allergen Reactions   Justicia Adhatoda Anaphylaxis   Peanut-Containing Drug Products Anaphylaxis   Avocado    Cat Hair Extract    Dog Epithelium    Grass Pollen(K-O-R-T-Swt Vern)    Other     Too much cheese/dairy- GI upset Tree nuts, Environmental Allergies, Feathers, Dogs, Cats    Pollen Extract    Hydrocodone-Acetaminophen Rash    HOME MEDICATIONS: Outpatient Medications Prior to Visit  Medication Sig Dispense Refill   albuterol (VENTOLIN HFA) 108 (90 Base) MCG/ACT inhaler Inhale 1-2 puffs into the lungs every 6 (six) hours as needed for wheezing or shortness of breath. 90 g 1   ferrous sulfate 325 (65 FE) MG tablet Take 325 mg by mouth daily.     fexofenadine (ALLEGRA) 180 MG tablet Take 180 mg by mouth daily.     methocarbamol (ROBAXIN) 500 MG tablet Take 1 tablet (500 mg total) by mouth 2 (two) times daily. 14 tablet 0   QUEtiapine (SEROQUEL XR) 50 MG TB24 24 hr tablet TAKE 1 TABLET BY MOUTH EVERY NIGHT FOR 4 DAYS. INCREASE TO 2 TABLET DAILY THEREAFTER 60 tablet 3   QUEtiapine (SEROQUEL) 25 MG tablet TAKE 1 TABLET(25 MG) BY MOUTH AT BEDTIME 90 tablet 1   sertraline  (ZOLOFT) 100 MG tablet TAKE 1 TABLET(100 MG) BY MOUTH DAILY 90 tablet 0   sertraline (ZOLOFT) 25 MG tablet Take 1 tablet (25 mg total) by mouth daily. Take in addition to 100mg  tab for total of 125mg  daily 90 tablet 1   No facility-administered medications prior to visit.    PAST MEDICAL HISTORY: Past Medical History:  Diagnosis Date   Asthma    Seasonal allergies     PAST SURGICAL HISTORY: Past Surgical History:  Procedure Laterality Date   UMBILICAL HERNIA REPAIR  2003    FAMILY HISTORY: Family History  Problem Relation Age of Onset   Hypertension Maternal Grandmother    Diabetes Maternal Grandfather    Arthritis Father    Cataracts Father    Glaucoma Father    Migraines Father    Headache Father    Hypothyroidism Mother    Headache Mother    Migraines Mother    Depression Mother    Anxiety disorder Mother    Schizophrenia Paternal Uncle    Autism Other    Seizures Neg Hx    Bipolar disorder Neg Hx    ADD / ADHD Neg Hx     SOCIAL HISTORY: Social History   Socioeconomic History   Marital status: Single    Spouse name: Not on file   Number of children: Not on file   Years of education: Not on file   Highest education level: Not on file  Occupational History   Not on file  Tobacco Use   Smoking status: Some Days    Types: E-cigarettes   Smokeless tobacco: Never  Vaping Use   Vaping Use: Some days  Substance and Sexual Activity   Alcohol use: No   Drug use: Yes    Types: Marijuana    Comment: daily - 1 joint a day   Sexual activity: Never  Other Topics Concern   Not on file  Social History Narrative   Currently at [employer]   Social Determinants of Health   Financial Resource Strain: Not on file  Food Insecurity: Not on file  Transportation Needs: Not on file  Physical Activity: Not on file  Stress: Not on file  Social Connections: Not on file  Intimate Partner Violence: Not on file     PHYSICAL EXAM ***  GENERAL  EXAM/CONSTITUTIONAL: Vitals: There were no vitals filed for this visit. There is no height or weight on file to calculate BMI. Wt Readings from Last 3 Encounters:  04/18/22 136 lb 4 oz (61.8 kg)  04/07/22 128 lb (58.1 kg)  02/25/22 128 lb 4.9 oz (58.2 kg)   Patient is in no distress; well developed, nourished and groomed; neck is supple  CARDIOVASCULAR: Examination of carotid arteries is normal; no carotid bruits Regular rate and rhythm, no murmurs Examination of peripheral vascular system by observation and palpation is normal  EYES: Pupils round and reactive to light, Visual fields full to confrontation, Extraocular movements intacts,   MUSCULOSKELETAL: Gait, strength, tone, movements noted in Neurologic exam below  NEUROLOGIC: MENTAL STATUS:      No data to display         awake, alert, oriented to person, place and time recent and remote memory intact normal attention and concentration language fluent, comprehension intact, naming intact fund of knowledge appropriate  CRANIAL NERVE:  2nd - no papilledema or hemorrhages on fundoscopic exam 2nd, 3rd, 4th, 6th - pupils equal and reactive to light, visual fields full to confrontation, extraocular muscles intact, no nystagmus 5th - facial sensation symmetric 7th - facial strength symmetric 8th - hearing intact 9th - palate elevates symmetrically, uvula midline 11th - shoulder shrug symmetric 12th - tongue protrusion midline  MOTOR:  normal bulk and tone, full strength in the BUE, BLE  SENSORY:  normal and symmetric to light touch, pinprick, temperature, vibration  COORDINATION:  finger-nose-finger, fine finger movements normal  REFLEXES:  deep tendon reflexes present and symmetric  GAIT/STATION:  normal     DIAGNOSTIC DATA (LABS, IMAGING, TESTING) - I reviewed patient records, labs, notes, testing and imaging myself where available.  Lab Results  Component Value Date   WBC 9.3 09/06/2020   HGB 13.2  09/06/2020   HCT 42.7 09/06/2020   MCV 82.6 09/06/2020   PLT 344 09/06/2020      Component Value Date/Time   NA 138 09/06/2020 0623   K 4.1 09/06/2020 0623   CL 104 09/06/2020 0623   CO2 25 09/06/2020 0623   GLUCOSE 93 09/06/2020 0623   BUN 10 09/06/2020 0623   CREATININE 0.85 09/06/2020 0623   CALCIUM 9.1 09/06/2020 0623   PROT 7.4 11/17/2016 1950   ALBUMIN 4.6 11/17/2016 1950   AST 28 11/17/2016 1950   ALT 14 11/17/2016 1950   ALKPHOS 100 11/17/2016 1950   BILITOT 0.4 11/17/2016 1950   GFRNONAA >60 09/06/2020 0623   GFRAA NOT CALCULATED 11/17/2016 1950   No results found for: "CHOL", "HDL", "LDLCALC", "LDLDIRECT", "TRIG", "CHOLHDL" No results found for: "HGBA1C" No results found for: "  [id number]" Lab Results  Component Value Date   TSH 1.18 08/28/2017    ***    ASSESSMENT AND PLAN  21 y.o. year old female with ***   No diagnosis found.    PLAN:   No orders of the defined types were placed in this encounter.   No orders of the defined types were placed in this encounter.   No follow-ups on file.    Ocie Doyne, MD  I spent an average of *** chart reviewing and counseling the patient, with at least 50% of the time face to face with the patient.   [facility] [address] [place], [place] [address] [phone]

## 2022-04-30 IMAGING — CT CT ABD-PELV W/ CM
2 of 4 series · 15 of 46 positions shown, 17 images · IV contrast (omnipaque)
Comparison: Pelvis ultrasound yesterday.

CLINICAL DATA: 19-year-old female with abdominal pain, back pain.
Progressive pain since yesterday now with vomiting.

EXAM:
CT ABDOMEN AND PELVIS WITH CONTRAST
TECHNIQUE: Multidetector CT imaging of the abdomen and pelvis was performed
using the standard protocol following bolus administration of
intravenous contrast.
CONTRAST:  80mL OMNIPAQUE IOHEXOL 300 MG/ML  SOLN

[Series 2: axial st · axial · 0.63mm/px · z∈[-404,-49]mm · 12 of 83 slices shown, 14 images]
[im 6/83  soft-tissue]
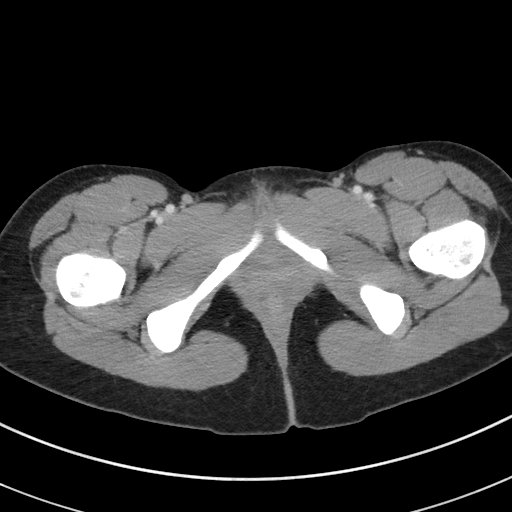
[im 6/83  bone]
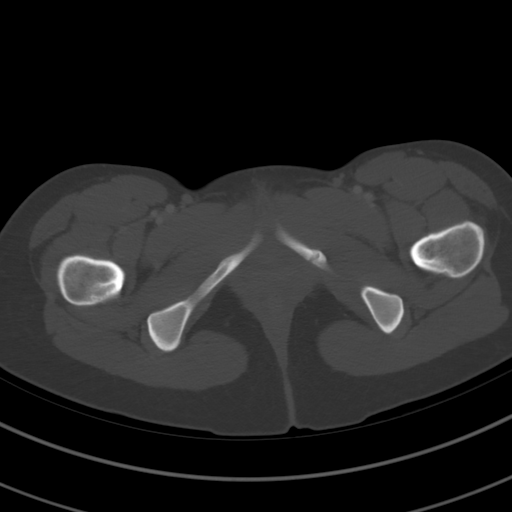
[im 12/83  soft-tissue]
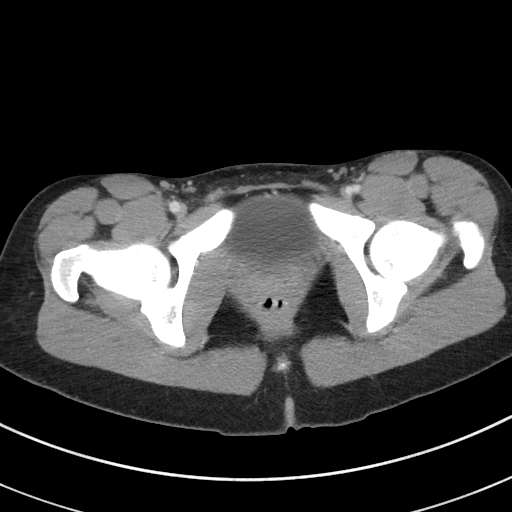
[im 18/83  soft-tissue]
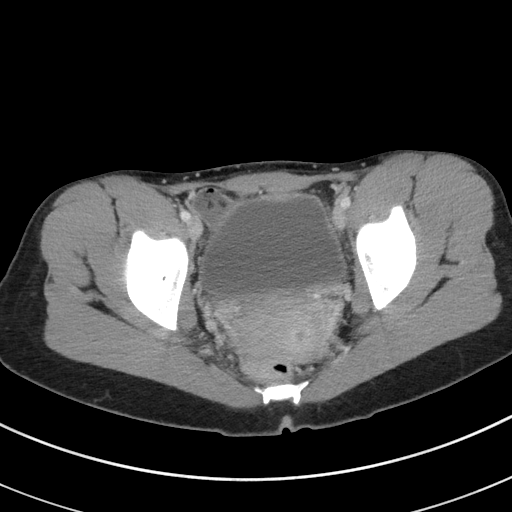
[im 24/83  soft-tissue]
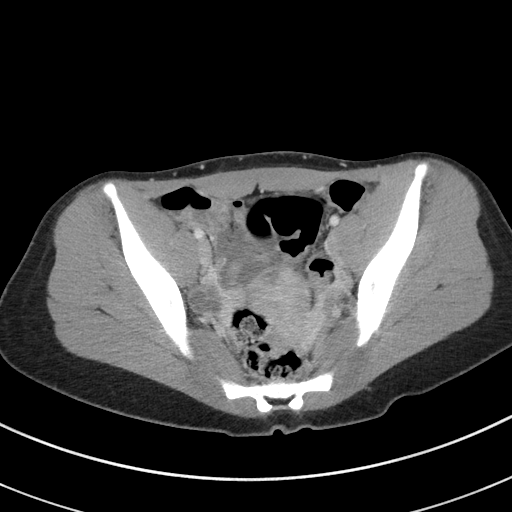
[im 33/83  soft-tissue]
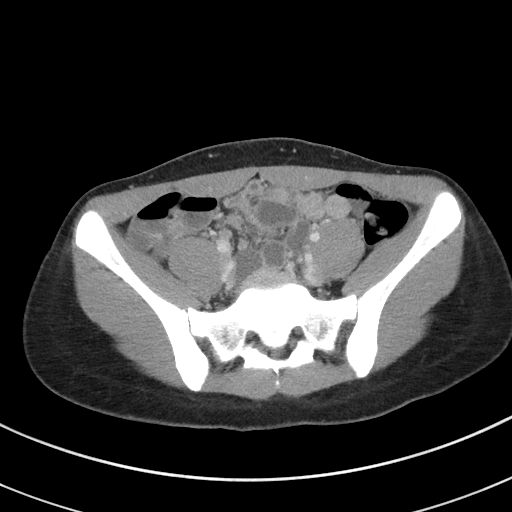
[im 39/83  soft-tissue]
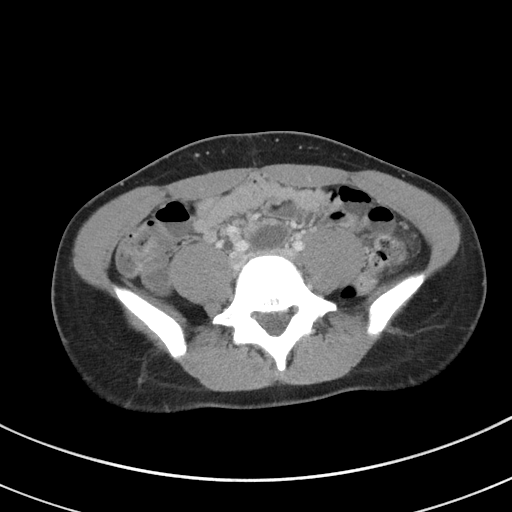
[im 44/83  soft-tissue]
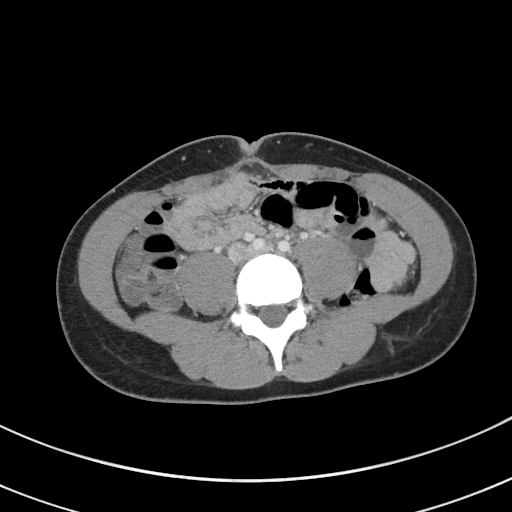
[im 50/83  soft-tissue]
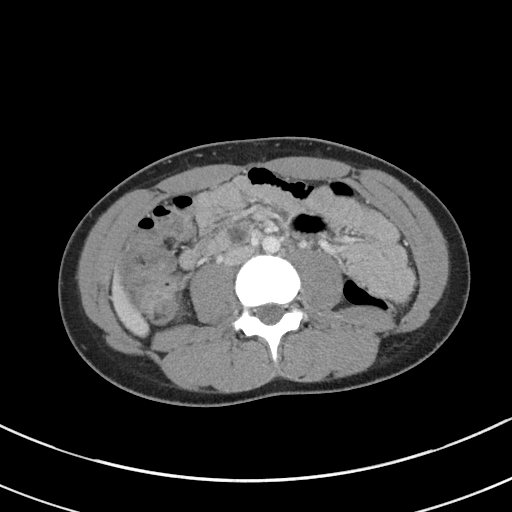
[im 59/83  soft-tissue]
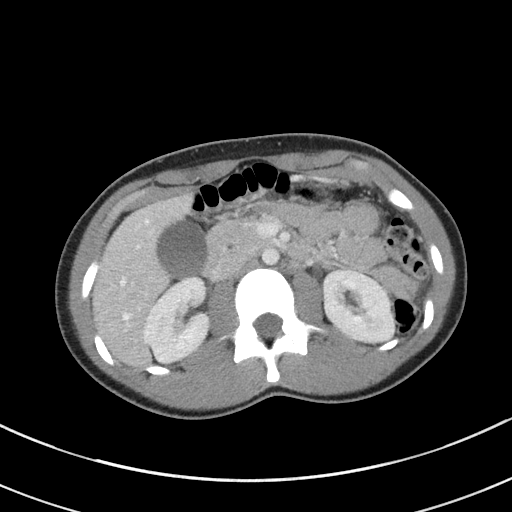
[im 59/83  bone]
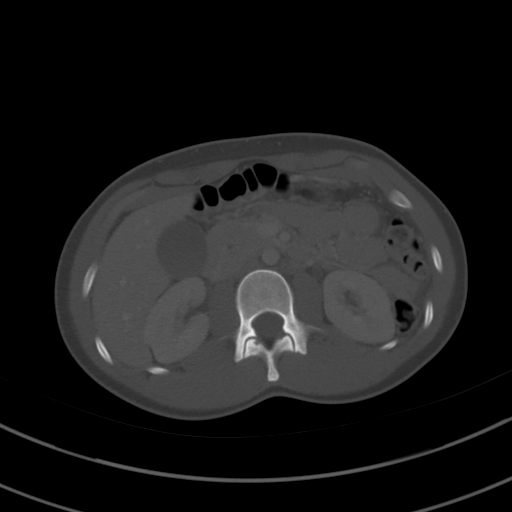
[im 65/83  soft-tissue]
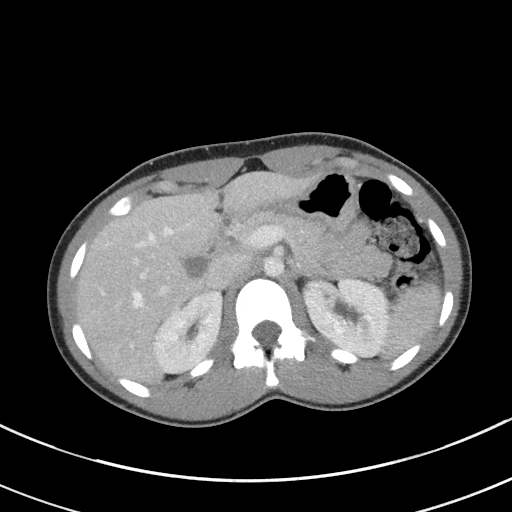
[im 71/83  soft-tissue]
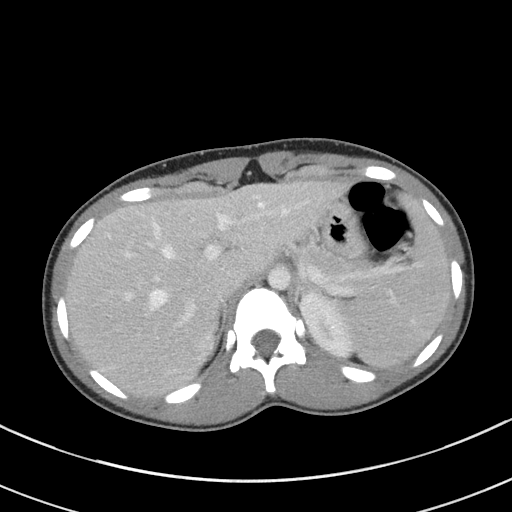
[im 77/83  soft-tissue]
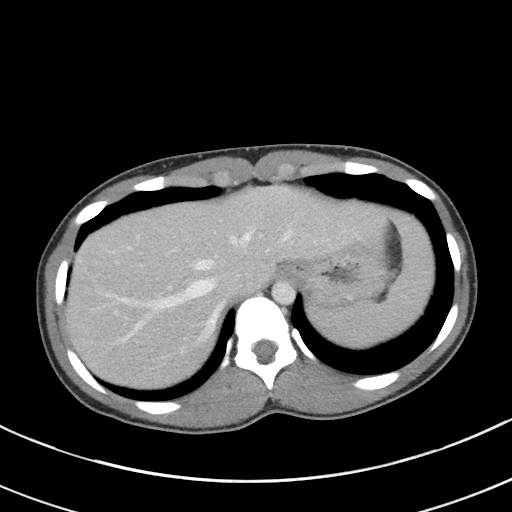

[Series 5: coronal st · coronal · 0.58mm/px · 3 of 63 slices shown]
[im 21/63  soft-tissue]
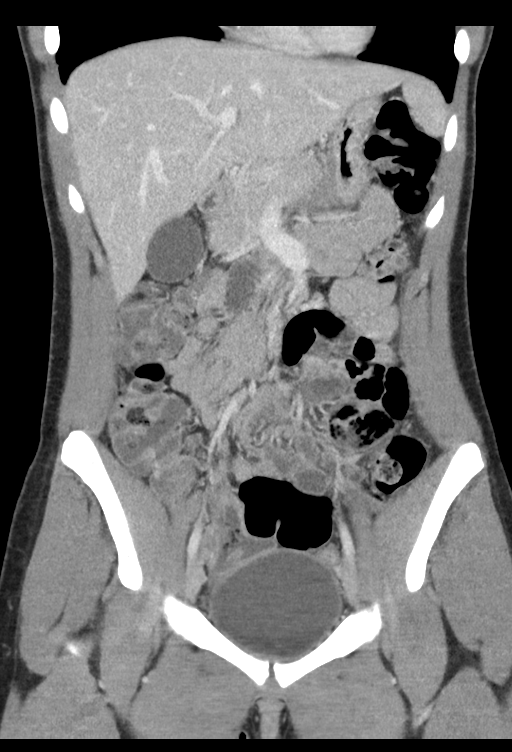
[im 28/63  soft-tissue]
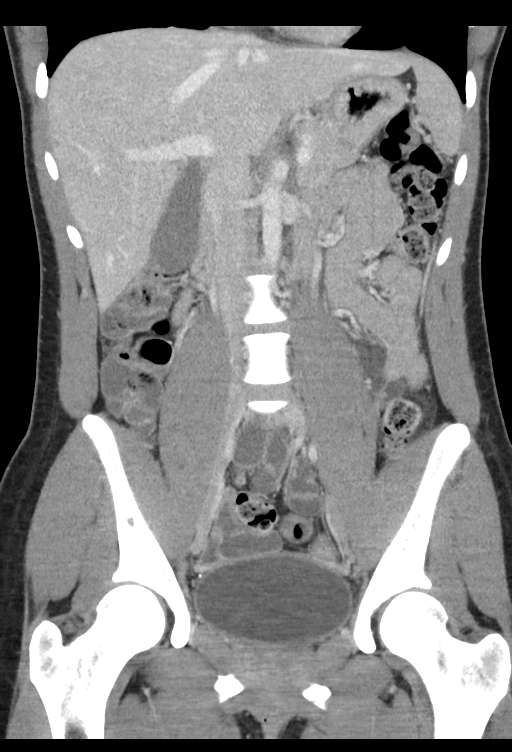
[im 35/63  soft-tissue]
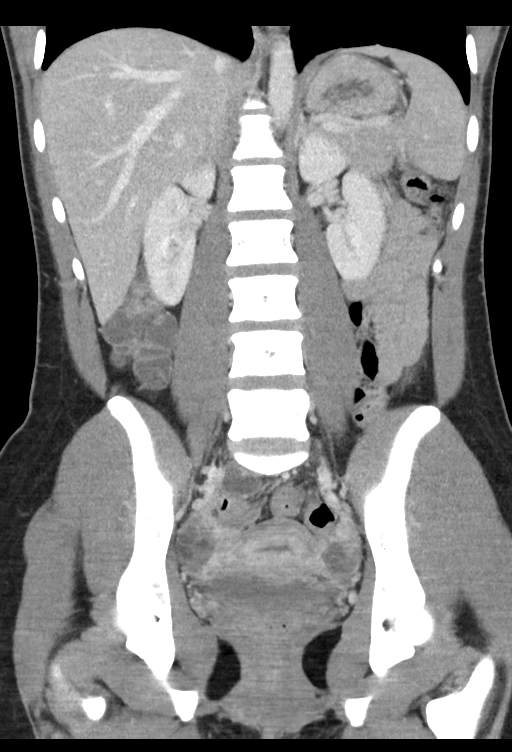

[15 of 46 positions shown; findings below may reference images not displayed]

FINDINGS: Lower chest: Negative.

Hepatobiliary: Negative liver and gallbladder. No bile duct
enlargement.

Pancreas: Negative.

Spleen: Negative.

Adrenals/Urinary Tract: Normal adrenal glands. Kidneys enhance
symmetrically and appear normal. No nephrolithiasis or perinephric
stranding. Renal collecting systems and proximal ureters appear
within normal limits. Negative bladder. No urinary calculus
identified.

Stomach/Bowel: Mild retained stool in the rectosigmoid colon. Highly
redundant sigmoid. Gas and stool in the descending colon and at the
splenic flexure. Mildly redundant transverse. More liquid stool in
the right colon. Normal appendix identified (coronal image 29). No
large bowel inflammation. Terminal ileum difficult to delineate, and
there are some fluid-filled small bowel loops in the pelvis, but
there is no dilated small bowel. Stomach and duodenum appear
negative. No free air. No free fluid or mesenteric stranding.

Vascular/Lymphatic: Major arterial structures are patent and normal.
Portal venous system is patent. No lymphadenopathy.

Reproductive: Within normal limits.

Other: No pelvic free fluid.

Musculoskeletal: Negative; left inferior pubic ramus likely nearing
skeletal maturity on series 2, image 78.
IMPRESSION: Normal appendix. No acute or inflammatory process identified in the
abdomen or pelvis.

IT downtime report was issued on this study at 1518 hours on
09/07/2020.

## 2022-05-03 ENCOUNTER — Other Ambulatory Visit: Payer: Self-pay

## 2022-05-03 DIAGNOSIS — F419 Anxiety disorder, unspecified: Secondary | ICD-10-CM

## 2022-05-03 MED ORDER — SERTRALINE HCL 100 MG PO TABS: ORAL_TABLET | ORAL | 0 refills | Status: DC

## 2022-05-03 NOTE — Telephone Encounter (Signed)
Forms have been faxed and placed in scan  

## 2022-05-03 NOTE — Telephone Encounter (Signed)
Form completed and returned to [facility] 

## 2022-05-04 ENCOUNTER — Encounter: Payer: Self-pay | Admitting: Family Medicine

## 2022-05-10 NOTE — Telephone Encounter (Signed)
Forms completed and returned to [facility] 

## 2022-05-10 NOTE — Telephone Encounter (Signed)
Patient called to follow up, status of forms for work. Noted she needs forms to have leave dates from 9/28-11/6/23 and where it mentions restrictions needs to be checked yes so the patient may work from home due to inability to drive or make it to the office. Requesting this exception be for 6 months to 1 year. Patient final deadline is 05/16/22 but needs this asap.   I did reprint the form and cover sheet in case this has not yet been printed for you.   Patient wants emailed back at [email or web address]

## 2022-06-12 ENCOUNTER — Other Ambulatory Visit: Payer: Self-pay | Admitting: Family Medicine

## 2022-06-12 DIAGNOSIS — F419 Anxiety disorder, unspecified: Secondary | ICD-10-CM

## 2022-06-22 ENCOUNTER — Ambulatory Visit: Payer: BC Managed Care – PPO | Admitting: Psychiatry

## 2022-06-22 ENCOUNTER — Encounter: Payer: Self-pay | Admitting: Psychiatry

## 2022-06-22 VITALS — BP 135/79 | HR 97 | Ht 64.0 in | Wt 139.6 lb

## 2022-06-22 DIAGNOSIS — G43009 Migraine without aura, not intractable, without status migrainosus: Secondary | ICD-10-CM

## 2022-06-22 DIAGNOSIS — M5412 Radiculopathy, cervical region: Secondary | ICD-10-CM | POA: Diagnosis not present

## 2022-06-22 DIAGNOSIS — M542 Cervicalgia: Secondary | ICD-10-CM

## 2022-06-22 DIAGNOSIS — R29818 Other symptoms and signs involving the nervous system: Secondary | ICD-10-CM

## 2022-06-22 DIAGNOSIS — R519 Headache, unspecified: Secondary | ICD-10-CM | POA: Diagnosis not present

## 2022-06-22 DIAGNOSIS — F0781 Postconcussional syndrome: Secondary | ICD-10-CM

## 2022-06-22 MED ORDER — LORAZEPAM 0.5 MG PO TABS: ORAL_TABLET | ORAL | 0 refills | Status: DC

## 2022-06-22 MED ORDER — SUMATRIPTAN SUCCINATE 50 MG PO TABS: 50.0000 mg | ORAL_TABLET | ORAL | 6 refills | Status: DC | PRN

## 2022-06-22 MED ORDER — PROPRANOLOL HCL 20 MG PO TABS: 20.0000 mg | ORAL_TABLET | Freq: Every day | ORAL | 6 refills | Status: DC

## 2022-06-22 NOTE — Patient Instructions (Addendum)
Plan: -MRI brain, cervical -Referral to physical therapy for the neck -Start propranolol 20 mg at bedtime for headache prevention -Start sumatriptan as needed for migraines. Take at the onset of migraine. If headache recurs or does not fully resolve, you may take a second dose after 2 hours.    Post Concussive Syndrome:  Post-concussion syndrome is a complex disorder in which various symptoms -- such as headaches and dizziness -- last for weeks and sometimes months after the injury that caused the concussion. Concussion is a mild traumatic brain injury, usually occurring after a blow to the head. Loss of consciousness isn't required for a diagnosis of concussion or post-concussion syndrome. In fact, the risk of post-concussion syndrome doesn't appear to be associated with the severity of the initial injury. In most people, post-concussion syndrome symptoms occur within the first seven to 10 days and go away within three months, though they can persist for a year or more. Post-concussion syndrome treatments are aimed at easing specific symptoms.  Post-concussion symptoms include: Headaches Headaches that occur after a concussion can vary and may feel like tension-type headaches or migraines. Most, however, are tension-type headaches, which may be associated with a neck injury that happened at the same time as the head injury. Dizziness  Fatigue  Irritability  In some cases, people experience behavior or emotional changes after a mild traumatic brain injury. Family members may notice that the person has become more irritable, suspicious, argumentative or stubborn. Anxiety  Insomnia  Loss of concentration and memory  Noise and light sensitivity   Causes of concussion: In many cases, both physiological effects of brain trauma and emotional reactions to these effects play a role in the development of symptoms. Researchers haven't determined why some people who've had concussions develop  persistent post-concussion symptoms while others do not. No proven correlation between the severity of the injury and the likelihood of developing persistent post-concussion symptoms exists. Risk factors for developing post-concussion syndrome include: Age. Studies have found increasing age to be a risk factor for post-concussion syndrome.  Sex. Women are more likely to be diagnosed with post-concussion syndrome, but this may be because women are generally more likely to seek medical care.  Trauma. Concussions resulting from car collisions, falls, assaults and sports injuries are commonly associated with post-concussion syndrome.  Treatment: There is no specific treatment for post-concussion syndrome. Instead, your doctor will treat the individual symptoms you're experiencing. The types of symptoms and their frequency are unique to each person.  Instead of stopping activities entirely, learn to recognize the triggers that bring on concussion symptoms. Start back slowly, in small amounts. When symptoms occur, back off and rest. It's okay to do some of the activities that don't make you feel worse. Limit any activities that worsen your symptoms.  For example, activities that may bring on symptoms include: Texting/spending time looking at your smartphone screen.  Reading.  Watching television.  Playing video games.  Listening to loud music.  Doing any physical activity. As your symptoms improve, you can continue to add more of your activities back into your day.  Headaches Medications commonly used for migraines or tension headaches, including some antidepressants, appear to be effective when these types of headaches are associated with post-concussion syndrome. Examples include: Amitriptyline. This medication has been widely used for post-traumatic injuries, as well as for symptoms commonly associated with post-concussion syndrome, such as irritability, dizziness and depression. Topiramate.  Commonly used to treat migraines, topiramate (Qudexy XR, Topamax, Trokendi XR) may be effective  in reducing headaches after head injury. Common side effects of topiramate include weight loss and cognitive problems.  Gabapentin. Gabapentin (Gralise, Neurontin) is frequently used to treat a variety of types of pain and may be helpful in treating post-traumatic headaches. A common side effect of gabapentin is drowsiness.  Light sensitivity       Post concussion suggestions to follow when reading:  1. limit the amount of information on the page  2. use index cards or cardboard to cover the page or computer  3. for computers and digital devices: decrease the brightness, increase the font size and increase the contrast  4. read in 10 minute blocks and take 10-20 minutes of rest (no near focusing) try to increase to 3- 4 total blocks before increasing the time 5. use audio reinforcement for books or lectures 6. wear a brimmed hat if overhead lights are producing glare 7. Consider tints:  amber or blue, anti-reflective or blue light blocking,  Sunglasses when outside and [facility] or Blue glasses for indoor and night time.  Memory and thinking problems No medications are currently recommended specifically for the treatment of cognitive problems after mild traumatic brain injury. Time may be the best therapy for post-concussion syndrome if you have cognitive problems, as most of them go away on their own in the weeks to months following the injury. Certain forms of cognitive therapy may be helpful, including focused rehabilitation that provides training in how to use a pocket calendar, Education officer, community or other techniques to work around memory deficits and attention skills. Relaxation therapy also may help.  Dizziness Vestibular rehab (a specialized form of physical therapy can help this.  Depression and anxiety The symptoms of post-concussion syndrome often improve after the affected person learns that  there is a cause for his or her symptoms and that they will likely improve with time. Education about the disorder can ease a person's fears and help provide peace of mind. If you're experiencing new or increasing depression or anxiety after a concussion, some treatment options include: Psychotherapy. It may be helpful to discuss your concerns with a psychologist or psychiatrist who has experience in working with people with brain injury.  Medication. To combat anxiety or depression, antidepressants or anti-anxiety medications may be prescribed.  Prevention: The only known way to prevent post-concussion syndrome is to avoid the head injury in the first place. Avoiding head injuries Although you can't prepare for every potential situation, here are some tips for avoiding common causes of head injuries: Fasten your seat belt whenever you're traveling in a car, and be sure children are in age-appropriate safety seats. Children under 13 are safest riding in the back seat, especially if your car has air bags.  Use helmets whenever you or your children are bicycling, roller-skating, in-line skating, ice-skating, skiing, snowboarding, playing football, batting or running the bases in softball or baseball, skateboarding, or horseback riding. Wear a helmet when riding a motorcycle.  Take steps around the house to prevent falls, such as removing small area rugs, improving lighting and installing handrails.

## 2022-06-22 NOTE — Progress Notes (Signed)
[facility]  PATIENT: Annette Hart DOB: Apr 07, 2001  REFERRING CLINICIAN: Sheliah Hatch, MD HISTORY FROM: self, mother REASON FOR VISIT: post-concussive symptoms   HISTORICAL  CHIEF COMPLAINT:  Chief Complaint  Patient presents with   Room 1    Pt is here with her Mother. Pt states that she feels like her body is here and her head is not. Pt states that she can't stand sound. Pt states that she can't drive. Pt states that she has headaches that last a few hours. Pt states that she is very fatigue. Pt states that she has memory loss.     HISTORY OF PRESENT ILLNESS:  The patient presents for evaluation of post-concussive symptoms following a head trauma. On 04/05/22 she hit her head on a car door. Afterwards she developed headaches and difficulty concentrating. She presented to the ED on 04/09/22 for these symptoms, where [facility] and CT C-spine were unremarkable.  Her symptoms were starting to improve, but then hit her head a second time in October. Was being carried when she hit her head against a wall. She did not seek medical evaluation after this episode.  She continues to experience daily headaches which are associated photophobia and phonophobia. Looking at computer screens at work triggers her headaches. Has not been able to drive because the lights and sounds bother her. She also reports neck pain which will intermittently radiate down her right arm. Denies weakness or paresthesias in her hands. Sometimes feels like her right hand will cramp up.   She also reports brain fog and difficulty concentrating. Will lose her train of thought in the middle of a sentence. Does feel like this has been improving over time. Struggles with short term memory.  Has been struggling with depression due to her inability to function at the same level she was prior to the accident. Sleeps a lot of the time and does not want to leave the house.  States she has had 4  concussions since 2020.  OTHER MEDICAL CONDITIONS: GERD, anxiety, depression   REVIEW OF SYSTEMS: Full 14 system review of systems performed and negative with exception of: headaches, memory loss, fatigue  ALLERGIES: Allergies  Allergen Reactions   Justicia Adhatoda Anaphylaxis   Peanut-Containing Drug Products Anaphylaxis   Avocado    Cat Hair Extract    Dog Epithelium    Grass Pollen(K-O-[id number]-T-Swt Vern)    Other     Too much cheese/dairy- GI upset Tree nuts, Environmental Allergies, Feathers, Dogs, Cats    Pollen Extract    Hydrocodone-Acetaminophen Rash    HOME MEDICATIONS: Outpatient Medications Prior to Visit  Medication Sig Dispense Refill   albuterol (VENTOLIN HFA) 108 (90 Base) MCG/ACT inhaler Inhale 1-2 puffs into the lungs every 6 (six) hours as needed for wheezing or shortness of breath. 90 g 1   ferrous sulfate 325 (65 FE) MG tablet Take 325 mg by mouth daily.     fexofenadine (ALLEGRA) 180 MG tablet Take 180 mg by mouth daily.     QUEtiapine (SEROQUEL XR) 50 MG TB24 24 hr tablet TAKE 1 TABLET BY MOUTH EVERY NIGHT FOR 4 DAYS. INCREASE TO 2 TABLETS EVERY NIGHT 60 tablet 3   QUEtiapine (SEROQUEL) 25 MG tablet TAKE 1 TABLET(25 MG) BY MOUTH AT BEDTIME 90 tablet 1   sertraline (ZOLOFT) 100 MG tablet TAKE 1 TABLET(100 MG) BY MOUTH DAILY 90 tablet 0   sertraline (ZOLOFT) 25 MG tablet Take 1 tablet (25 mg total) by mouth daily. Take in addition  to 100mg  tab for total of 125mg  daily 90 tablet 1   methocarbamol (ROBAXIN) 500 MG tablet Take 1 tablet (500 mg total) by mouth 2 (two) times daily. 14 tablet 0   No facility-administered medications prior to visit.    PAST MEDICAL HISTORY: Past Medical History:  Diagnosis Date   Asthma    Seasonal allergies     PAST SURGICAL HISTORY: Past Surgical History:  Procedure Laterality Date   UMBILICAL HERNIA REPAIR  2003    FAMILY HISTORY: Family History  Problem Relation Age of Onset   Hypertension Maternal Grandmother     Diabetes Maternal Grandfather    Arthritis Father    Cataracts Father    Glaucoma Father    Migraines Father    Headache Father    Hypothyroidism Mother    Headache Mother    Migraines Mother    Depression Mother    Anxiety disorder Mother    Schizophrenia Paternal Uncle    Autism Other    Seizures Neg Hx    Bipolar disorder Neg Hx    ADD / ADHD Neg Hx     SOCIAL HISTORY: Social History   Socioeconomic History   Marital status: Single    Spouse name: Not on file   Number of children: Not on file   Years of education: Not on file   Highest education level: Not on file  Occupational History   Not on file  Tobacco Use   Smoking status: Some Days    Types: E-cigarettes   Smokeless tobacco: Never  Vaping Use   Vaping Use: Some days  Substance and Sexual Activity   Alcohol use: No   Drug use: Yes    Types: Marijuana    Comment: daily - 1 joint a day   Sexual activity: Never  Other Topics Concern   Not on file  Social History Narrative   Currently at [employer]   Social Determinants of Health   Financial Resource Strain: Not on file  Food Insecurity: Not on file  Transportation Needs: Not on file  Physical Activity: Not on file  Stress: Not on file  Social Connections: Not on file  Intimate Partner Violence: Not on file     PHYSICAL EXAM  GENERAL EXAM/CONSTITUTIONAL: Vitals:  Vitals:   06/22/22 1356  BP: 135/79  Pulse: 97  Weight: 139 lb 9.6 oz (63.3 kg)  Height: 5\' 4"  (1.626 m)   Body mass index is 23.96 kg/m. Wt Readings from Last 3 Encounters:  06/22/22 139 lb 9.6 oz (63.3 kg)  04/18/22 136 lb 4 oz (61.8 kg)  04/07/22 128 lb (58.1 kg)   NEUROLOGIC: MENTAL STATUS:  awake, alert, oriented to person, place and time recent and remote memory intact Loses train of thought multiple times during visit language fluent, comprehension intact fund of knowledge appropriate  CRANIAL NERVE:  2nd, 3rd, 4th, 6th - pupils equal and reactive to light, visual  fields full to confrontation, extraocular muscles intact, no nystagmus 5th - decreased sensation left V1 7th - facial strength symmetric 8th - hearing intact 9th - palate elevates symmetrically, uvula midline 11th - shoulder shrug symmetric 12th - tongue protrusion midline  MOTOR:  normal bulk and tone, full strength in the BUE, BLE  SENSORY:  Decreased sensation to light touch LUE, otherwise intact to light touch throughout  COORDINATION:  finger-nose-finger intact bilaterally  REFLEXES:  deep tendon reflexes present and symmetric  GAIT/STATION:  normal     DIAGNOSTIC DATA (LABS, IMAGING, TESTING) -  I reviewed patient records, labs, notes, testing and imaging myself where available.  Lab Results  Component Value Date   WBC 9.3 09/06/2020   HGB 13.2 09/06/2020   HCT 42.7 09/06/2020   MCV 82.6 09/06/2020   PLT 344 09/06/2020      Component Value Date/Time   NA 138 09/06/2020 0623   K 4.1 09/06/2020 0623   CL 104 09/06/2020 0623   CO2 25 09/06/2020 0623   GLUCOSE 93 09/06/2020 0623   BUN 10 09/06/2020 0623   CREATININE 0.85 09/06/2020 0623   CALCIUM 9.1 09/06/2020 0623   PROT 7.4 11/17/2016 1950   ALBUMIN 4.6 11/17/2016 1950   AST 28 11/17/2016 1950   ALT 14 11/17/2016 1950   ALKPHOS 100 11/17/2016 1950   BILITOT 0.4 11/17/2016 1950   GFRNONAA >60 09/06/2020 0623   GFRAA NOT CALCULATED 11/17/2016 1950   No results found for: "CHOL", "HDL", "LDLCALC", "LDLDIRECT", "TRIG", "CHOLHDL" No results found for: "HGBA1C" No results found for: "VITAMINB12" Lab Results  Component Value Date   TSH 1.18 08/28/2017    ASSESSMENT AND PLAN  21 y.o. year old female with a history of GERD, anxiety, depression who presents for evaluation of headaches, brain fog, and fatigue following a head trauma in September 2023. Her symptoms are most consistent with post-concussive syndrome. Will order brain MRI as her exam reveals decreased sensation over her left face and upper  extremity. MRI C-spine ordered as she reports post-traumatic radicular symptoms. Will refer to neck PT for cervicalgia. For her migraines will start low dose propranolol as she is concerned about sedating side effects. Imitrex started for migraine rescue.   1. Migraine without aura and without status migrainosus, not intractable   2. Headache with neurologic deficit   3. Cervical radiculopathy   4. Cervicalgia   5. Post concussive syndrome       PLAN: -MRI brain, C-spine -Prevention: Start propranolol 20 mg QHS, uptitrate as needed -Rescue: Start Imitrex 50 mg PRN -Referral to neck PT  Orders Placed This Encounter  Procedures   MR BRAIN W WO CONTRAST   MR CERVICAL SPINE WO CONTRAST   Ambulatory referral to Physical Therapy    Meds ordered this encounter  Medications   LORazepam (ATIVAN) 0.5 MG tablet    Sig: Take 1-2 pills 30 minutes prior to MRIs    Dispense:  4 tablet    Refill:  0   propranolol (INDERAL) 20 MG tablet    Sig: Take 1 tablet (20 mg total) by mouth at bedtime.    Dispense:  30 tablet    Refill:  6   SUMAtriptan (IMITREX) 50 MG tablet    Sig: Take 1 tablet (50 mg total) by mouth every 2 (two) hours as needed for migraine. May repeat in 2 hours if headache persists or recurs.    Dispense:  10 tablet    Refill:  6    Return in about 6 months (around 12/22/2022).    Ocie Doyne, MD 06/22/22 4:22 PM  I spent an average of 61 minutes chart reviewing and counseling the patient, with at least 50% of the time face to face with the patient.   [facility] [address], [address] [place], [place] [address] [phone]

## 2022-06-29 ENCOUNTER — Telehealth: Payer: Self-pay | Admitting: Psychiatry

## 2022-06-29 NOTE — Telephone Encounter (Signed)
Pt scheduled for 75 mins MR brain w/wo contrast and MR cervical spine wo contrast at [facility] for 07/26/22 at 10:00am  [employer] IWLN#[id number] (exp.06/28/22-07/27/22)

## 2022-06-30 ENCOUNTER — Encounter: Payer: Self-pay | Admitting: Psychiatry

## 2022-06-30 MED ORDER — RIZATRIPTAN BENZOATE 5 MG PO TABS: 5.0000 mg | ORAL_TABLET | ORAL | 6 refills | Status: DC | PRN

## 2022-07-06 ENCOUNTER — Ambulatory Visit: Payer: BC Managed Care – PPO

## 2022-07-06 DIAGNOSIS — R29818 Other symptoms and signs involving the nervous system: Secondary | ICD-10-CM | POA: Diagnosis not present

## 2022-07-06 DIAGNOSIS — M5412 Radiculopathy, cervical region: Secondary | ICD-10-CM | POA: Diagnosis not present

## 2022-07-06 DIAGNOSIS — R519 Headache, unspecified: Secondary | ICD-10-CM | POA: Diagnosis not present

## 2022-07-06 MED ORDER — GADOBENATE DIMEGLUMINE 529 MG/ML IV SOLN
13.0000 mL | Freq: Once | INTRAVENOUS | Status: AC | PRN
  Administered 2022-07-06: 13 mL via INTRAVENOUS

## 2022-07-07 ENCOUNTER — Other Ambulatory Visit: Payer: Self-pay

## 2022-07-07 DIAGNOSIS — F419 Anxiety disorder, unspecified: Secondary | ICD-10-CM

## 2022-07-07 MED ORDER — QUETIAPINE FUMARATE 25 MG PO TABS: ORAL_TABLET | ORAL | 1 refills | Status: DC

## 2022-07-19 ENCOUNTER — Ambulatory Visit: Payer: Self-pay | Admitting: Physical Therapy

## 2022-07-23 ENCOUNTER — Other Ambulatory Visit: Payer: Self-pay | Admitting: Family Medicine

## 2022-07-23 DIAGNOSIS — F32A Depression, unspecified: Secondary | ICD-10-CM

## 2022-07-26 ENCOUNTER — Other Ambulatory Visit: Payer: BC Managed Care – PPO

## 2022-07-26 ENCOUNTER — Ambulatory Visit: Payer: BC Managed Care – PPO | Admitting: Family Medicine

## 2022-07-28 ENCOUNTER — Ambulatory Visit: Payer: No Typology Code available for payment source | Attending: Psychiatry

## 2022-07-28 DIAGNOSIS — R293 Abnormal posture: Secondary | ICD-10-CM | POA: Insufficient documentation

## 2022-07-28 DIAGNOSIS — M542 Cervicalgia: Secondary | ICD-10-CM

## 2022-07-28 DIAGNOSIS — M6281 Muscle weakness (generalized): Secondary | ICD-10-CM

## 2022-07-28 NOTE — Therapy (Signed)
OUTPATIENT PHYSICAL THERAPY CERVICAL EVALUATION   Patient Name: Annette Hart MRN: [id number] DOB:January 25, 2001, 22 y.o., female Today's Date: 07/29/2022  END OF SESSION:  PT End of Session - 07/28/22 1452     Visit Number 1    Number of Visits 7    Date for PT Re-Evaluation 10/20/22   to allow for scheduling delays   Authorization Type [employer]    PT Start Time 5681   patient late   PT Stop Time 1530    PT Time Calculation (min) 38 min    Activity Tolerance Patient tolerated treatment well    Behavior During Therapy [facility] for tasks assessed/performed             Past Medical History:  Diagnosis Date   Asthma    Seasonal allergies    Past Surgical History:  Procedure Laterality Date   UMBILICAL HERNIA REPAIR  2003   Patient Active Problem List   Diagnosis Date Noted   Moderate recurrent major depression ([place]) 11/27/2020   Physical exam 06/23/2020   Borderline personality disorder ([place]) 03/20/2020   Dysmenorrhea in adolescent 11/10/2016   Anxiety and depression 11/10/2016   GERD (gastroesophageal reflux disease) 11/10/2016   Migraine without aura and without status migrainosus, not intractable 08/12/2016   Allergic reaction 11/07/2013    PCP: Annye Asa, MD  REFERRING PROVIDER: Genia Harold, MD  REFERRING DIAG: M54.2 (ICD-10-CM) - Cervicalgia  THERAPY DIAG:  Muscle weakness (generalized)  Abnormal posture  Cervicalgia  Rationale for Evaluation and Treatment: Rehabilitation  ONSET DATE: 06/22/2022  SUBJECTIVE:                                                                                                                                                                                                         SUBJECTIVE STATEMENT: Patient arrives to clinic late alone. She reports having 4 concussions in 3 years. Has a "bulging disc at C3-C4" per patient. Has tried heat, cold press, some rx, all without relief. Does report some L facial numbness as  well as L sided sciatica, though the latter has never been formally diagnosed. 1st concussion: fell out of bed at college and couldn't walk after with LOC (2020)  2nd concussion: stood up too fast and passed out (?) and hit head on floor (2021)  3rd: didn't duck head into car low enough and hit R temple (2023) 4th: was being carried and hit head on wall (2023)  Does have constant migraines with severe photo and phonophobia. Currently out of work due to concussion symptoms.   PERTINENT HISTORY:  Asthma, multiple concussions, seizure 1x in HS  PAIN:  Are you having pain? Yes: NPRS scale: 4/10 Pain location: HA Pain description: tightening/ squeezing head -> nausea   PRECAUTIONS: Fall  WEIGHT BEARING RESTRICTIONS: No  FALLS:  Has patient fallen in last 6 months? No  LIVING ENVIRONMENT: Lives with: lives with their family Lives in: House/apartment Stairs: Yes: External: 3 flights steps; on right going up Has following equipment at home: None  OCCUPATION: customer service (?); currently on medical leave; requires her to look at a lot of screens and use a headset  PLOF: Independent  PATIENT GOALS: "figure out lower back pain and relieve neck pain"   OBJECTIVE:   DIAGNOSTIC FINDINGS:  12/27 C spine MRI: This MRI of the cervical spine without contrast shows the following: Small right paramedian disc herniation at C3-C4 causing right foraminal narrowing and mild spinal stenosis.  Although there is no definite nerve root compression.  The disc does encroach upon the right C4 nerve root. The spinal cord is normal. 12/27 Brain MRI: This is a normal MRI of the brain with and without contrast.   PATIENT SURVEYS:  NDI 40% [place] post concussion questionnaire: 72  COGNITION: Overall cognitive status: Within functional limits for tasks assessed  SENSATION: L facial numbness; intermittent L UE numbness  POSTURE: rounded shoulders, forward head, and increased thoracic  kyphosis  PALPATION: Increased tension in R upper trap > L upper trap    CERVICAL ROM:  - generally limited with increased pain with flexion and lateral flexion B  Active ROM A/PROM (deg) eval  Flexion   Extension   Right lateral flexion   Left lateral flexion   Right rotation   Left rotation    (Blank rows = not tested)  CERVICAL SPECIAL TESTS:  Spurling's test: Positive, Distraction test: Positive, and Sharp pursor's test: Negative  LUMBAR SPECIAL TESTS:  Straight leg raise test: to be assessed and Slump test: to be assessed    TODAY'S TREATMENT:                                                                                                                              N/A eval  PATIENT EDUCATION:  Education details: PT POC, exam findings Person educated: Patient Education method: Explanation Education comprehension: verbalized understanding  HOME EXERCISE PROGRAM: To be provuded  ASSESSMENT:  CLINICAL IMPRESSION: Patient is a 22 y.o. female who was seen today for physical therapy evaluation and treatment for cervicalgia and HA related to multiple concussions. Patient scoring a 44/52 on the RP-Q indicating increased severity of symptoms post- concussion. She also scored a 40% on the NDI indicating moderate disability related to neck pain. She does have increased trigger points in B traps and suboccipitals, likely contributing to her pain and possibly some of her radicular symptoms. She would benefit from skilled PT services to address the above mentioned deficits.    OBJECTIVE IMPAIRMENTS: decreased knowledge of condition, decreased mobility, decreased  ROM, increased fascial restrictions, increased muscle spasms, impaired sensation, postural dysfunction, and pain.   ACTIVITY LIMITATIONS: carrying, stairs, hygiene/grooming, locomotion level, and caring for others  PARTICIPATION LIMITATIONS: interpersonal relationship, driving, shopping, community activity, and  occupation  PERSONAL FACTORS: Past/current experiences, Social background, Time since onset of injury/illness/exacerbation, Transportation, and 1-2 comorbidities: asthma, multiple concussions  are also affecting patient's functional outcome.   REHAB POTENTIAL: Fair time since onset and patient preference for 1x a week  CLINICAL DECISION MAKING: Evolving/moderate complexity  EVALUATION COMPLEXITY: Moderate   GOALS: Goals reviewed with patient? Yes  SHORT TERM GOALS: Target date: 08/19/22  Pt will be independent with initial HEP for improved symptom report  Baseline: to be provided Goal status: INITIAL  2.  Patient will improve RPQ score to </= 37/52 to indicate a reduction in her symptoms related to concussion Baseline: 44/52 Goal status: INITIAL  3.  Patient will improve her NDI score to </= 35% to indicate a reduction in disability related to neck pain Baseline: 40%  Goal status: INITIAL   LONG TERM GOALS: Target date: 09/23/22  Pt will be independent with final HEP for improved symptom report  Baseline: to be provided Goal status: INITIAL  2.  Patient will improve RPQ score to </= 30/52 to indicate a reduction in her symptoms related to concussion Baseline: 44/52 Goal status: INITIAL  3.  Patient will improve hr NDI score to </= 30% to indicate a reduction in disability related to neck pain Baseline: 40% Goal status: INITIAL  PLAN:  PT FREQUENCY: 1x/week per patient request  PT DURATION: 6 weeks  PLANNED INTERVENTIONS: Therapeutic exercises, Therapeutic activity, Neuromuscular re-education, Balance training, Gait training, Patient/Family education, Self Care, Joint mobilization, Stair training, Vestibular training, Canalith repositioning, Visual/preceptual remediation/compensation, DME instructions, Aquatic Therapy, Dry Needling, Electrical stimulation, Spinal manipulation, Spinal mobilization, Cryotherapy, Moist heat, Taping, Traction, Manual therapy, and  Re-evaluation  PLAN FOR NEXT SESSION: assess lumbar spine, HEP, STM   Westley Foots, PT Westley Foots, PT, DPT, CBIS  07/29/2022, 7:50 AM

## 2022-07-29 ENCOUNTER — Ambulatory Visit: Payer: No Typology Code available for payment source | Admitting: Family Medicine

## 2022-07-29 ENCOUNTER — Encounter: Payer: Self-pay | Admitting: Family Medicine

## 2022-07-29 VITALS — BP 102/70 | HR 86 | Temp 99.9°F | Resp 17 | Ht 64.0 in | Wt 143.5 lb

## 2022-07-29 DIAGNOSIS — F0781 Postconcussional syndrome: Secondary | ICD-10-CM | POA: Diagnosis not present

## 2022-07-29 DIAGNOSIS — F331 Major depressive disorder, recurrent, moderate: Secondary | ICD-10-CM

## 2022-07-29 MED ORDER — QUETIAPINE FUMARATE 50 MG PO TABS: 50.0000 mg | ORAL_TABLET | Freq: Every day | ORAL | 1 refills | Status: DC

## 2022-07-29 NOTE — Patient Instructions (Signed)
Follow up in 4-6 weeks to recheck mood INCREASE the Quetiapine (Seroquel) to 50mg  nightly- 2 of what you have at home and 1 of the new prescription CONTINUE the Sertraline 125mg  daily Follow up w/ Neuro and PT regarding headaches and post-concussion symptoms Continue to drink plenty of water to help w/ headaches Call with any questions or concerns Stay Safe!  Stay Healthy! Hang in there!!!

## 2022-07-29 NOTE — Assessment & Plan Note (Signed)
Deteriorated.  Pt feels that her physical sxs as well as her deteriorating relationship w/ mom are dramatically impacting her mood.  Reports that she did accidentally take 2 Seroquel 1 night and slept better w/ improved mood the next day.  Will increase Seroquel to 50mg  nightly and continue the Sertraline at 125mg  daily.  Pt expressed understanding and is in agreement w/ plan.

## 2022-07-29 NOTE — Progress Notes (Signed)
   Subjective:    Patient ID: Annette Hart, female    DOB: [birth year], 22 y.o.   MRN: [id number]  HPI Post-concussion syndrome- saw Neuro 12/13 after hitting her head twice in the span of a month in Sept and October.  Reports daily migraines.  Pt started PT yesterday.  Returned to work in November after initial injury.  Has been out of work x2 weeks and says she can't do her job given her sxs- bothered by computer screens, audio, stress level.  She says she isn't able to do her job even w/ accommodations.  Wants to take a leave of absence to keep her job.  Has f/u w/ neuro next month.  Depression- 'my depression is the worst that it's been in months'.  Pt reports she mistakenly took 2 of the Seroquel one night and felt that she slept better and mood was better the next day.  Currently  on Sertraline 125mg  daily   Review of Systems For ROS see HPI     Objective:   Physical Exam Vitals reviewed.  Constitutional:      General: She is not in acute distress.    Appearance: Normal appearance. She is not ill-appearing.  HENT:     Head: Normocephalic and atraumatic.  Eyes:     Extraocular Movements: Extraocular movements intact.     Conjunctiva/sclera: Conjunctivae normal.     Pupils: Pupils are equal, round, and reactive to light.  Skin:    General: Skin is warm and dry.  Neurological:     General: No focal deficit present.     Mental Status: She is alert and oriented to person, place, and time.     Coordination: Coordination normal.     Gait: Gait normal.  Psychiatric:        Mood and Affect: Mood normal.        Behavior: Behavior normal.        Thought Content: Thought content normal.           Assessment & Plan:

## 2022-07-29 NOTE — Assessment & Plan Note (Signed)
Continues to struggle w/ post concussive sxs.  Has seen neuro and just started PT yesterday.  Tried to return to work in November and worked through Christmas but at this time, feels like she is unable to do her job based on continued sxs.  Even with accommodations, she is unable to function at expected levels.  In order to save her position, she would like to take a leave of absence.  Will submit forms for me to complete.

## 2022-07-30 ENCOUNTER — Encounter: Payer: Self-pay | Admitting: Family Medicine

## 2022-07-31 ENCOUNTER — Encounter: Payer: Self-pay | Admitting: Psychiatry

## 2022-08-01 NOTE — Telephone Encounter (Signed)
Please see attachment below. 

## 2022-08-02 ENCOUNTER — Other Ambulatory Visit: Payer: Self-pay | Admitting: *Deleted

## 2022-08-02 ENCOUNTER — Encounter: Payer: Self-pay | Admitting: *Deleted

## 2022-08-02 MED ORDER — NURTEC 75 MG PO TBDP: 1.0000 | ORAL_TABLET | ORAL | 11 refills | Status: AC | PRN

## 2022-08-15 ENCOUNTER — Telehealth: Payer: Self-pay | Admitting: Family Medicine

## 2022-08-15 ENCOUNTER — Telehealth: Payer: Self-pay

## 2022-08-15 DIAGNOSIS — Z0279 Encounter for issue of other medical certificate: Secondary | ICD-10-CM

## 2022-08-15 NOTE — Telephone Encounter (Signed)
Form completed and returned to [facility] 

## 2022-08-15 NOTE — Telephone Encounter (Signed)
Patient called stating that these forms were due last Wednesday. Patient wants to know if she could get these forms back sooner. The best contact info this patient is [phone].

## 2022-08-15 NOTE — Telephone Encounter (Signed)
Pt sent forms thru my chart to be completed for leave of absence .Charge sheet attached and placed in Dr Birdie Riddle to be signed folder . Made pt aware that provider has 7-10 business days to complete forms

## 2022-08-15 NOTE — Telephone Encounter (Signed)
Forms have been emailed to the email stated by pt  [email or web address] . Cover sheet faxed to Lorriane Shire and copies are in my black binder at desk

## 2022-08-15 NOTE — Telephone Encounter (Signed)
error 

## 2022-08-15 NOTE — Telephone Encounter (Signed)
Left pt a VM stating I see where she sent a my chart message on 07/30/22 in regards to these forms but I never received this message or printed off the forms till  today . I ask Dr Birdie Riddle if she had them from previous message as well she does not have . I explained to the pt that I will ask D Tabori if she can complete them soon but I can promise that due to she has 7 to 10 business days to complete.

## 2022-08-17 ENCOUNTER — Ambulatory Visit: Payer: No Typology Code available for payment source

## 2022-08-22 ENCOUNTER — Ambulatory Visit: Payer: No Typology Code available for payment source | Admitting: Family Medicine

## 2022-08-24 ENCOUNTER — Ambulatory Visit: Payer: No Typology Code available for payment source | Attending: Psychiatry

## 2022-08-29 ENCOUNTER — Encounter: Payer: Self-pay | Admitting: Family Medicine

## 2022-08-31 ENCOUNTER — Ambulatory Visit: Payer: No Typology Code available for payment source

## 2022-08-31 NOTE — Therapy (Unsigned)
PT LVM for patient as she has no-showed 3x to her PT appointments 2/7, 2/14, 2/21. Instructed patient to call office at [phone] to inform us if she wants to continue with PT. If we do not hear from her, we will assume that she wants to discharge and proceed with dc from PT.

## 2022-09-05 NOTE — Therapy (Deleted)
[facility] [address] [place], [place], [address] Phone: [phone]   Fax:  [phone]  Patient Details  Name: Annette Hart MRN: [id number] Date of Birth: July 20, 2000 Referring Provider:  Midge Minium, MD  Encounter Date: 08/31/2022  PHYSICAL THERAPY DISCHARGE SUMMARY  Visits from Start of Care: 1  Current functional level related to goals / functional outcomes: Unable to assess as patient has not returned since eval   Remaining deficits: Unable to assess as patient has not returned since eval   Education / Equipment: PT POC, unable to provide HEP or further ed as patient did not return since eval   Patient agrees to discharge. Patient goals were  unable to be assessed . Patient is being discharged due to not returning since the last visit.  [name], PT [name], PT, DPT, CBIS  09/05/2022, 7:57 AM  [facility] [address] [place], [place], [address] Phone: [phone]   Fax:  [phone]

## 2022-09-05 NOTE — Therapy (Unsigned)
[facility] [address] [place], [place], [address] Phone: [phone]   Fax:  [phone]  Patient Details  Name: Annette Hart MRN: [id number] Date of Birth: 07/21/2000 Referring Provider:  No ref. provider found  Encounter Date: 09/05/2022  PHYSICAL THERAPY DISCHARGE SUMMARY  Visits from Start of Care: 1  Current functional level related to goals / functional outcomes: Unable to assess as patient did not return since eval   Remaining deficits: Unable to assess as patient did not return since eval   Education / Equipment: PT POC, unable to provide HEP as patient did not return since eval   Patient agrees to discharge. Patient goals were  unable to be assessed . Patient is being discharged due to not returning since the last visit.  [name], PT [name], PT, DPT, CBIS  09/05/2022, 8:02 AM  [facility] [address] [place], [place], [address] Phone: [phone]   Fax:  [phone]

## 2022-09-07 ENCOUNTER — Ambulatory Visit: Payer: No Typology Code available for payment source | Admitting: Physical Therapy

## 2022-09-14 ENCOUNTER — Ambulatory Visit: Payer: No Typology Code available for payment source

## 2022-09-15 ENCOUNTER — Encounter: Payer: Self-pay | Admitting: Radiology

## 2022-09-21 ENCOUNTER — Ambulatory Visit: Payer: No Typology Code available for payment source

## 2022-10-19 ENCOUNTER — Other Ambulatory Visit: Payer: Self-pay

## 2022-10-19 MED ORDER — SERTRALINE HCL 25 MG PO TABS: 25.0000 mg | ORAL_TABLET | Freq: Every day | ORAL | 1 refills | Status: DC

## 2022-10-27 ENCOUNTER — Telehealth: Payer: Self-pay | Admitting: Family Medicine

## 2022-10-27 ENCOUNTER — Other Ambulatory Visit: Payer: Self-pay

## 2022-10-27 DIAGNOSIS — F419 Anxiety disorder, unspecified: Secondary | ICD-10-CM

## 2022-10-27 MED ORDER — SERTRALINE HCL 100 MG PO TABS: ORAL_TABLET | ORAL | 0 refills | Status: DC

## 2022-10-27 MED ORDER — SERTRALINE HCL 25 MG PO TABS
25.0000 mg | ORAL_TABLET | Freq: Every day | ORAL | 1 refills | Status: AC
Start: 2022-10-27 — End: ?

## 2022-10-27 NOTE — Telephone Encounter (Signed)
Refills are sent.

## 2022-10-27 NOTE — Telephone Encounter (Signed)
Encourage patient to contact the pharmacy for refills or they can request refills through [facility]  WHAT PHARMACY WOULD THEY LIKE THIS SENT TO:   [id number] - [place], [place] - [address]  MEDICATION NAME & DOSE:  sertraline (ZOLOFT) 25 MG tablet ** Pt not sure how many she has left  sertraline (ZOLOFT) 100 MG tablet ** Pt has 0 refills on this one propranolol (INDERAL) 20 MG tablet ** She would this one filled as well   NOTES/COMMENTS FROM PATIENT:      Front office please notify patient: It takes 48-72 hours to process rx refill requests Ask patient to call pharmacy to ensure rx is ready before heading there.

## 2022-11-13 ENCOUNTER — Encounter (HOSPITAL_BASED_OUTPATIENT_CLINIC_OR_DEPARTMENT_OTHER): Payer: Self-pay | Admitting: Emergency Medicine

## 2022-11-13 ENCOUNTER — Emergency Department (HOSPITAL_BASED_OUTPATIENT_CLINIC_OR_DEPARTMENT_OTHER)
Admission: EM | Admit: 2022-11-13 | Discharge: 2022-11-13 | Disposition: A | Discharge status: Home / Self Care | Payer: No Typology Code available for payment source | Attending: Emergency Medicine | Admitting: Emergency Medicine

## 2022-11-13 DIAGNOSIS — R1013 Epigastric pain: Secondary | ICD-10-CM | POA: Insufficient documentation

## 2022-11-13 DIAGNOSIS — R112 Nausea with vomiting, unspecified: Secondary | ICD-10-CM | POA: Diagnosis not present

## 2022-11-13 DIAGNOSIS — Z91010 Allergy to peanuts: Secondary | ICD-10-CM | POA: Insufficient documentation

## 2022-11-13 HISTORY — DX: Anxiety disorder, unspecified: F41.9

## 2022-11-13 HISTORY — DX: Depression, unspecified: F32.A

## 2022-11-13 LAB — CBC WITH DIFFERENTIAL/PLATELET
Abs Immature Granulocytes: 0.03 10*3/uL (ref 0.00–0.07)
Basophils Absolute: 0 10*3/uL (ref 0.0–0.1)
Basophils Relative: 0 %
Eosinophils Absolute: 0.1 10*3/uL (ref 0.0–0.5)
Eosinophils Relative: 1 %
HCT: 39.8 % (ref 36.0–46.0)
Hemoglobin: 12.5 g/dL (ref 12.0–15.0)
Immature Granulocytes: 0 %
Lymphocytes Relative: 9 %
Lymphs Abs: 0.7 10*3/uL (ref 0.7–4.0)
MCH: 25.2 pg — ABNORMAL LOW (ref 26.0–34.0)
MCHC: 31.4 g/dL (ref 30.0–36.0)
MCV: 80.2 fL (ref 80.0–100.0)
Monocytes Absolute: 0.3 10*3/uL (ref 0.1–1.0)
Monocytes Relative: 3 %
Neutro Abs: 6.7 10*3/uL (ref 1.7–7.7)
Neutrophils Relative %: 87 %
Platelets: 324 10*3/uL (ref 150–400)
RBC: 4.96 MIL/uL (ref 3.87–5.11)
RDW: 13.2 % (ref 11.5–15.5)
WBC: 7.8 10*3/uL (ref 4.0–10.5)
nRBC: 0 % (ref 0.0–0.2)

## 2022-11-13 LAB — LIPASE, BLOOD: Lipase: 24 U/L (ref 11–51)

## 2022-11-13 LAB — COMPREHENSIVE METABOLIC PANEL
ALT: 16 U/L (ref 0–44)
AST: 30 U/L (ref 15–41)
Albumin: 4.1 g/dL (ref 3.5–5.0)
Alkaline Phosphatase: 81 U/L (ref 38–126)
Anion gap: 9 (ref 5–15)
BUN: 7 mg/dL (ref 6–20)
CO2: 25 mmol/L (ref 22–32)
Calcium: 8.6 mg/dL — ABNORMAL LOW (ref 8.9–10.3)
Chloride: 106 mmol/L (ref 98–111)
Creatinine, Ser: 0.92 mg/dL (ref 0.44–1.00)
GFR, Estimated: >60 mL/min (ref >60)
Glucose, Bld: 109 mg/dL — ABNORMAL HIGH (ref 70–99)
Potassium: 3.7 mmol/L (ref 3.5–5.1)
Sodium: 140 mmol/L (ref 135–145)
Total Bilirubin: 0.8 mg/dL (ref 0.3–1.2)
Total Protein: 7.3 g/dL (ref 6.5–8.1)

## 2022-11-13 LAB — URINALYSIS, ROUTINE W REFLEX MICROSCOPIC
Bilirubin Urine: NEGATIVE
Glucose, UA: NEGATIVE mg/dL
Hgb urine dipstick: NEGATIVE
Ketones, ur: NEGATIVE mg/dL
Leukocytes,Ua: NEGATIVE
Nitrite: NEGATIVE
Protein, ur: 30 mg/dL — AB
Specific Gravity, Urine: 1.02 (ref 1.005–1.030)
pH: 7.5 (ref 5.0–8.0)

## 2022-11-13 LAB — URINALYSIS, MICROSCOPIC (REFLEX): RBC / HPF: NONE SEEN RBC/hpf (ref 0–5)

## 2022-11-13 LAB — PREGNANCY, URINE: Preg Test, Ur: NEGATIVE

## 2022-11-13 MED ORDER — PANTOPRAZOLE SODIUM 40 MG IV SOLR
40.0000 mg | Freq: Once | INTRAVENOUS | Status: AC
  Administered 2022-11-13: 40 mg via INTRAVENOUS
  Filled 2022-11-13: qty 10

## 2022-11-13 MED ORDER — FAMOTIDINE IN NACL 20-0.9 MG/50ML-% IV SOLN
20.0000 mg | Freq: Once | INTRAVENOUS | Status: AC
  Administered 2022-11-13: 20 mg via INTRAVENOUS
  Filled 2022-11-13: qty 50

## 2022-11-13 MED ORDER — ONDANSETRON 4 MG PO TBDP: 4.0000 mg | ORAL_TABLET | Freq: Three times a day (TID) | ORAL | 0 refills | Status: DC | PRN

## 2022-11-13 MED ORDER — SODIUM CHLORIDE 0.9 % IV BOLUS
1000.0000 mL | Freq: Once | INTRAVENOUS | Status: AC
  Administered 2022-11-13: 1000 mL via INTRAVENOUS

## 2022-11-13 MED ORDER — OMEPRAZOLE 20 MG PO CPDR: 20.0000 mg | DELAYED_RELEASE_CAPSULE | Freq: Every day | ORAL | 0 refills | Status: DC

## 2022-11-13 MED ORDER — ONDANSETRON HCL 4 MG/2ML IJ SOLN
4.0000 mg | Freq: Once | INTRAMUSCULAR | Status: AC
  Administered 2022-11-13: 4 mg via INTRAVENOUS
  Filled 2022-11-13: qty 2

## 2022-11-13 NOTE — Discharge Instructions (Signed)
Reduce your alcohol intake.  Start with a clear liquid diet and advance slowly as tolerated.  Return to the ED with worsening pain, vomiting, not able to eat or drink or any other concerns

## 2022-11-13 NOTE — ED Notes (Signed)
Patient given second cup of water and gingerale and instructed to try and drink them. Will attempt.

## 2022-11-13 NOTE — ED Triage Notes (Signed)
Pt reports emesis since 0600 this am. Was drinking etoh last night, not sure how much. Pt ambulatory to triage.

## 2022-11-13 NOTE — ED Provider Notes (Signed)
[facility] Provider Note   CSN: [id number] Arrival date & time: 11/13/22  1052     History  Chief Complaint  Patient presents with   Emesis    Annette Hart is a 22 y.o. female.  Patient presents with pain, nausea and vomiting since this morning. Was drinking heavily last night.  States she was drinking brandy and tequila.  Does not know how many drinks that she had.  Woke up this morning with epigastric pain and nausea and vomiting every 30 minutes.  Emesis is green and yellow with some brown and red streaks.  No diarrhea.  No cough or fever.  No pain with urination or blood in the urine.  No travel or sick contacts.  Denies any possibility of pregnancy. Still has appendix and gallbladder.  Previous umbilical hernia repair as a child.  The history is provided by the patient.  Emesis Associated symptoms: abdominal pain   Associated symptoms: no arthralgias, no cough, no diarrhea, no fever, no headaches and no myalgias        Home Medications Prior to Admission medications   Medication Sig Start Date End Date Taking? Authorizing Provider  albuterol (VENTOLIN HFA) 108 (90 Base) MCG/ACT inhaler Inhale 1-2 puffs into the lungs every 6 (six) hours as needed for wheezing or shortness of breath. 01/27/22   Sheliah Hatch, MD  ferrous sulfate 325 (65 FE) MG tablet Take 325 mg by mouth daily.    [provider]  fexofenadine (ALLEGRA) 180 MG tablet Take 180 mg by mouth daily.    [provider]  propranolol (INDERAL) 20 MG tablet Take 1 tablet (20 mg total) by mouth at bedtime. 06/22/22   Ocie Doyne, MD  QUEtiapine (SEROQUEL) 50 MG tablet Take 1 tablet (50 mg total) by mouth at bedtime. 07/29/22   Sheliah Hatch, MD  Rimegepant Sulfate (NURTEC) 75 MG TBDP Take 1 tablet (75 mg total) by mouth as needed. No more than 1 tablet in 24 hours. 08/02/22   Penumalli, Glenford Bayley, MD  sertraline (ZOLOFT) 100 MG tablet Take  one tablet daily 10/27/22   Sheliah Hatch, MD  sertraline (ZOLOFT) 25 MG tablet Take 1 tablet (25 mg total) by mouth daily. Take in addition to 100mg  tab for total of 125mg  daily 10/27/22   Sheliah Hatch, MD      Allergies    Bevelyn Buckles, Peanut-containing drug products, Avocado, Cat hair extract, Dog epithelium, Grass pollen(k-o-r-t-swt vern), Maxalt [rizatriptan], Other, Pollen extract, and Hydrocodone-acetaminophen    Review of Systems   Review of Systems  Constitutional:  Positive for activity change and appetite change. Negative for fever.  HENT:  Negative for congestion.   Respiratory:  Negative for cough, chest tightness and shortness of breath.   Gastrointestinal:  Positive for abdominal pain, nausea and vomiting. Negative for diarrhea.  Genitourinary:  Negative for dysuria and hematuria.  Musculoskeletal:  Negative for arthralgias and myalgias.  Neurological:  Negative for dizziness, weakness and headaches.   all other systems are negative except as noted in the HPI and PMH.    Physical Exam Updated Vital Signs BP 117/84 (BP Location: Right Arm)   Pulse 84   Temp 98.1 F (36.7 C) (Oral)   Resp 16   Ht 5\' 4"  (1.626 m)   Wt 65.8 kg   SpO2 98%   BMI 24.89 kg/m  Physical Exam Vitals and nursing note reviewed.  Constitutional:      General: She  is not in acute distress.    Appearance: She is well-developed.  HENT:     Head: Normocephalic and atraumatic.     Mouth/Throat:     Pharynx: No oropharyngeal exudate.  Eyes:     Conjunctiva/sclera: Conjunctivae normal.     Pupils: Pupils are equal, round, and reactive to light.  Neck:     Comments: No meningismus. Cardiovascular:     Rate and Rhythm: Normal rate and regular rhythm.     Heart sounds: Normal heart sounds. No murmur heard. Pulmonary:     Effort: Pulmonary effort is normal. No respiratory distress.     Breath sounds: Normal breath sounds.  Abdominal:     Palpations: Abdomen is soft.      Tenderness: There is abdominal tenderness. There is no guarding or rebound.     Comments: Epigastric tenderness  Musculoskeletal:        General: No tenderness. Normal range of motion.     Cervical back: Normal range of motion and neck supple.  Skin:    General: Skin is warm.  Neurological:     Mental Status: She is alert and oriented to person, place, and time.     Cranial Nerves: No cranial nerve deficit.     Motor: No abnormal muscle tone.     Coordination: Coordination normal.     Comments:  5/5 strength throughout. CN 2-12 intact.Equal grip strength.   Psychiatric:        Behavior: Behavior normal.     ED Results / Procedures / Treatments   Labs (all labs ordered are listed, but only abnormal results are displayed) Labs Reviewed  URINALYSIS, ROUTINE W REFLEX MICROSCOPIC - Abnormal; Notable for the following components:      Result Value   Protein, ur 30 (*)    All other components within normal limits  CBC WITH DIFFERENTIAL/PLATELET - Abnormal; Notable for the following components:   MCH 25.2 (*)    All other components within normal limits  COMPREHENSIVE METABOLIC PANEL - Abnormal; Notable for the following components:   Glucose, Bld 109 (*)    Calcium 8.6 (*)    All other components within normal limits  URINALYSIS, MICROSCOPIC (REFLEX) - Abnormal; Notable for the following components:   Bacteria, UA FEW (*)    All other components within normal limits  PREGNANCY, URINE  LIPASE, BLOOD    EKG None  Radiology No results found.  Procedures Procedures    Medications Ordered in ED Medications  sodium chloride 0.9 % bolus 1,000 mL (has no administration in time range)  ondansetron (ZOFRAN) injection 4 mg (has no administration in time range)  famotidine (PEPCID) IVPB 20 mg premix (has no administration in time range)  pantoprazole (PROTONIX) injection 40 mg (has no administration in time range)    ED Course/ Medical Decision Making/ A&P                              Medical Decision Making Amount and/or Complexity of Data Reviewed Labs: ordered. Decision-making details documented in ED Course. Radiology: ordered and independent interpretation performed. Decision-making details documented in ED Course. ECG/medicine tests: ordered and independent interpretation performed. Decision-making details documented in ED Course.  Risk Prescription drug management.  Upper abdominal pain, nausea, vomiting after drinking heavily last night.  Vitals stable, no distress, abdomen soft without peritoneal signs.  Will hydrate, treat symptoms, IV Protonix and Zofran.  Check labs including hCG.  Low suspicion  for acute surgical problem  Patient given IV fluids and nausea and pain medications.  Suspect likely alcoholic gastritis secondary excessive consumption last night.  Low suspicion for acute surgical pathology.  HCG is negative, urinalysis is negative, LFTs, lipase and CBC are normal.  On recheck,  patient is tolerating p.o.  Abdomen is soft without peritoneal signs. Reduce alcohol intake, avoid caffeine, NSAIDs, spicy foods.  Will treat symptoms and follow-up with PCP.  Start with clear liquid diet, advance slowly as tolerated.  Return to the ED with new or worsening symptoms.        Final Clinical Impression(s) / ED Diagnoses Final diagnoses:  Nausea and vomiting, unspecified vomiting type    Rx / DC Orders ED Discharge Orders     None         Clevester Helzer, Jeannett Senior, MD 11/13/22 1434

## 2022-11-13 NOTE — ED Notes (Signed)
Gave pt water for PO challenge  

## 2022-11-13 NOTE — ED Notes (Signed)
D/c paperwork reviewed with pt, including prescriptions and follow up care.  No questions or concerns voiced at time of d/c. . Pt verbalized understanding, Ambulatory without assistance to ED exit, NAD.   

## 2022-12-20 ENCOUNTER — Other Ambulatory Visit: Payer: Self-pay

## 2022-12-20 MED ORDER — QUETIAPINE FUMARATE 50 MG PO TABS: 50.0000 mg | ORAL_TABLET | Freq: Every day | ORAL | 1 refills | Status: DC

## 2022-12-20 NOTE — Progress Notes (Signed)
Received fax requesting refill Seroquel 50mg  reviewed chart and refill is appropriate

## 2023-01-11 ENCOUNTER — Ambulatory Visit: Payer: No Typology Code available for payment source | Admitting: Adult Health

## 2023-01-25 ENCOUNTER — Telehealth: Payer: Self-pay | Admitting: Family Medicine

## 2023-01-25 NOTE — Telephone Encounter (Signed)
Patient called and stated that she used to take 25 mg of seroquel and 50 mg. She is needing the 25 sent in to the pharmacy. I let her know that I currently only had the 50mg  active in her med list. She did ask that I send a refill request to Dr. Beverely Low. I did let her know that she is overdue for a follow up, got her scheduled for Monday to discuss mood and medications. Pharmacy is up to date.

## 2023-01-25 NOTE — Telephone Encounter (Signed)
She takes an additional 25mg  of the Sertraline but she has not been taking the Seroquel 25 and 50mg  together.  We increased from the 25mg  --> 50mg  and that is what she has been taking nightly.

## 2023-01-26 NOTE — Telephone Encounter (Signed)
I called and left pt a vm to call the office

## 2023-01-27 ENCOUNTER — Telehealth: Payer: Self-pay | Admitting: Family Medicine

## 2023-01-27 NOTE — Telephone Encounter (Signed)
Pt called back. °

## 2023-01-27 NOTE — Telephone Encounter (Signed)
Called patient the issue she has is that her Seroquel was changed from XR to IR notes she takes 2 Seroquel 50 mg XR tablets at night usually along with a Seroquel 25 mg IR tablet this is separate from the sertraline 100 mg and 25 mg tablets that she also takes at night.    She needs [place] to send the Seroquel 50 mg as XR and the Seroquel 25 mg IR to the pharmacy otherwise she is doing well    Okay to send?

## 2023-01-30 ENCOUNTER — Ambulatory Visit (INDEPENDENT_AMBULATORY_CARE_PROVIDER_SITE_OTHER): Payer: No Typology Code available for payment source | Admitting: Family Medicine

## 2023-01-30 ENCOUNTER — Encounter: Payer: Self-pay | Admitting: Family Medicine

## 2023-01-30 VITALS — BP 110/78 | HR 89 | Temp 97.9°F | Resp 18 | Ht 64.0 in | Wt 151.1 lb

## 2023-01-30 DIAGNOSIS — F419 Anxiety disorder, unspecified: Secondary | ICD-10-CM | POA: Diagnosis not present

## 2023-01-30 DIAGNOSIS — Z1341 Encounter for autism screening: Secondary | ICD-10-CM | POA: Diagnosis not present

## 2023-01-30 DIAGNOSIS — F32A Depression, unspecified: Secondary | ICD-10-CM

## 2023-01-30 MED ORDER — QUETIAPINE FUMARATE ER 50 MG PO TB24: 100.0000 mg | ORAL_TABLET | Freq: Every day | ORAL | 1 refills | Status: DC

## 2023-01-30 MED ORDER — QUETIAPINE FUMARATE 25 MG PO TABS: 25.0000 mg | ORAL_TABLET | Freq: Every day | ORAL | 1 refills | Status: DC

## 2023-01-30 NOTE — Addendum Note (Signed)
Addended by: Sheliah Hatch on: 01/30/2023 07:26 AM   Modules accepted: Orders

## 2023-01-30 NOTE — Telephone Encounter (Signed)
Prescriptions changed to reflect the way pt is taking Seroquel

## 2023-01-30 NOTE — Patient Instructions (Addendum)
Schedule your physical in 3 months No med changes at this time Check into Autism Screening options: - [employer] [phone] [facility] [phone] - [place] [phone]  Call with any questions or concerns Hang in there!!

## 2023-01-30 NOTE — Progress Notes (Unsigned)
   Subjective:    Patient ID: Annette Hart, female    DOB: [birth year], 22 y.o.   MRN: [id number]  HPI Anxiety/Depression- ongoing issue for pt.  Currently on Sertraline 125mg  daily, and Seroquel XR 100mg  daily and short acting Seroquel 25mg  daily.  Moved out of mom's house in Feb, got an apartment in March.  Lost her job in March and has not been able to find another job.  Is doing [employer].  Has been behind on rent and they've threatened her w/ eviction.  Continues to have self harm thoughts but has not acted on them.  Reports she is drinking a lot- ended up in the ER after aspirating vomit.  Family is now concerned about possible Autism diagnosis.  Reports she has trouble understanding people or colloquialisms.  Doesn't feel that her thought processes are similar to others.  Trouble making friends or participating in social situations. Pt reports she will not eat like other people- will lock on to a specific food and eat that for months at a time before switching to a new food.  Pt reports she is not interested in changing meds at this time.   Review of Systems For ROS see HPI     Objective:   Physical Exam Vitals reviewed.  Constitutional:      General: She is not in acute distress.    Appearance: Normal appearance. She is not ill-appearing.  HENT:     Head: Normocephalic and atraumatic.  Eyes:     Extraocular Movements: Extraocular movements intact.     Conjunctiva/sclera: Conjunctivae normal.  Skin:    General: Skin is warm and dry.  Neurological:     General: No focal deficit present.     Mental Status: She is alert and oriented to person, place, and time.     Cranial Nerves: No cranial nerve deficit.     Motor: No weakness.     Gait: Gait normal.  Psychiatric:     Comments: Flat affect, soft spoken           Assessment & Plan:

## 2023-01-31 DIAGNOSIS — Z1341 Encounter for autism screening: Secondary | ICD-10-CM | POA: Insufficient documentation

## 2023-01-31 NOTE — Assessment & Plan Note (Signed)
Ongoing issue for pt.  She feels that anxiety and depression are stable despite having increased stressors- lost job, threatened w/ eviction.  She reports ongoing thoughts of self harm but has not acted on them nor feels compelled to.  She does not want to change her medication at this time as she is more fearful of possible consequences of the change than she is of how she currently feels.  Will continue to follow.

## 2023-01-31 NOTE — Assessment & Plan Note (Signed)
New.  Pt reports that family and romantic partner are concerned that she is autistic.  She feels that would explain a lot of her social issues and her difficulties.  Provided her with multiple resources for evaluation/testing.  Will follow.

## 2023-03-15 ENCOUNTER — Other Ambulatory Visit: Payer: Self-pay

## 2023-03-15 DIAGNOSIS — F419 Anxiety disorder, unspecified: Secondary | ICD-10-CM

## 2023-03-15 MED ORDER — SERTRALINE HCL 100 MG PO TABS
ORAL_TABLET | ORAL | 1 refills | Status: AC
Start: 2023-03-15 — End: ?

## 2023-04-10 ENCOUNTER — Encounter: Payer: No Typology Code available for payment source | Admitting: Podiatry

## 2023-04-10 NOTE — Progress Notes (Signed)
Patient was a no-show for today's scheduled appointment.

## 2023-05-03 ENCOUNTER — Other Ambulatory Visit: Payer: Self-pay | Admitting: Family Medicine

## 2023-07-07 NOTE — Telephone Encounter (Signed)
error 

## 2023-09-12 ENCOUNTER — Telehealth: Payer: Self-pay

## 2023-09-12 MED ORDER — QUETIAPINE FUMARATE ER 50 MG PO TB24: 100.0000 mg | ORAL_TABLET | Freq: Every day | ORAL | 1 refills | Status: AC

## 2023-09-12 MED ORDER — QUETIAPINE FUMARATE 25 MG PO TABS: 25.0000 mg | ORAL_TABLET | Freq: Every day | ORAL | 1 refills | Status: AC

## 2023-09-12 NOTE — Telephone Encounter (Signed)
 Prescription sent as requested.

## 2024-01-18 ENCOUNTER — Emergency Department (HOSPITAL_BASED_OUTPATIENT_CLINIC_OR_DEPARTMENT_OTHER)

## 2024-01-18 ENCOUNTER — Other Ambulatory Visit (HOSPITAL_BASED_OUTPATIENT_CLINIC_OR_DEPARTMENT_OTHER): Payer: Self-pay

## 2024-01-18 ENCOUNTER — Other Ambulatory Visit: Payer: Self-pay

## 2024-01-18 ENCOUNTER — Emergency Department (HOSPITAL_BASED_OUTPATIENT_CLINIC_OR_DEPARTMENT_OTHER)
Admission: EM | Admit: 2024-01-18 | Discharge: 2024-01-18 | Disposition: A | Discharge status: Home / Self Care | Attending: Emergency Medicine | Admitting: Emergency Medicine

## 2024-01-18 DIAGNOSIS — M79672 Pain in left foot: Secondary | ICD-10-CM | POA: Diagnosis present

## 2024-01-18 DIAGNOSIS — Z91010 Allergy to peanuts: Secondary | ICD-10-CM | POA: Insufficient documentation

## 2024-01-18 DIAGNOSIS — W108XXA Fall (on) (from) other stairs and steps, initial encounter: Secondary | ICD-10-CM | POA: Diagnosis not present

## 2024-01-18 DIAGNOSIS — S92252A Displaced fracture of navicular [scaphoid] of left foot, initial encounter for closed fracture: Secondary | ICD-10-CM | POA: Diagnosis not present

## 2024-01-18 DIAGNOSIS — J45909 Unspecified asthma, uncomplicated: Secondary | ICD-10-CM | POA: Diagnosis not present

## 2024-01-18 DIAGNOSIS — S93402A Sprain of unspecified ligament of left ankle, initial encounter: Secondary | ICD-10-CM

## 2024-01-18 MED ORDER — IBUPROFEN 400 MG PO TABS
400.0000 mg | ORAL_TABLET | Freq: Once | ORAL | Status: AC
  Administered 2024-01-18: 400 mg via ORAL
  Filled 2024-01-18: qty 1

## 2024-01-18 MED ORDER — ACETAMINOPHEN 500 MG PO TABS
1000.0000 mg | ORAL_TABLET | Freq: Once | ORAL | Status: AC
  Administered 2024-01-18: 1000 mg via ORAL
  Filled 2024-01-18: qty 2

## 2024-01-18 MED ORDER — OXYCODONE-ACETAMINOPHEN 5-325 MG PO TABS: 1.0000 | ORAL_TABLET | Freq: Four times a day (QID) | ORAL | 0 refills | Status: DC | PRN

## 2024-01-18 MED ORDER — OXYCODONE-ACETAMINOPHEN 5-325 MG PO TABS
1.0000 | ORAL_TABLET | Freq: Four times a day (QID) | ORAL | 0 refills | Status: DC | PRN
  Filled 2024-01-18: qty 10, 3d supply, fill #0

## 2024-01-18 MED ORDER — OXYCODONE-ACETAMINOPHEN 5-325 MG PO TABS
1.0000 | ORAL_TABLET | Freq: Once | ORAL | Status: AC
  Administered 2024-01-18: 1 via ORAL
  Filled 2024-01-18: qty 1

## 2024-01-18 NOTE — Discharge Instructions (Addendum)
 It looks like you may have a avulsion fracture of your navicular bone which is a bone in your foot.  You also have an ankle sprain.  Wear the boot anytime you are up moving around and stay off of the foot for the next 2 weeks.  Call the sports medicine office for follow-up in 1 to 2 weeks.  Use the prescription pain medication only if ibuprofen  600mg  (3 tabs) does not work and make sure you are elevating your leg.

## 2024-01-18 NOTE — ED Triage Notes (Signed)
 Pt reports falling down stairs outside from slipping in the rain. Pt states her left ankle broke the fall.  Pt previously sprained right ankle a few months ago.

## 2024-01-18 NOTE — ED Provider Notes (Signed)
 [place] [facility] Provider Note   CSN: [id number] Arrival date & time: 01/18/24  [id number]     Patient presents with: Fall and Ankle Pain   Annette Hart is a 23 y.o. female.   Patient is a 23 year old female with a history of asthma, migraines who is presenting today after she slipped on wet steps and fell down approximately 6 stairs.  She reports significant pain to her left foot, ankle and some pain to the knee area.  This occurred right before she came.  She is having some numbness and tingling in her toes as well.  She denies hitting her head or loss of consciousness.  No pain to her upper extremities or back.  The history is provided by the patient.  Fall  Ankle Pain      Prior to Admission medications   Medication Sig Start Date End Date Taking? Authorizing Provider  oxyCODONE -acetaminophen  (PERCOCET/ROXICET) 5-325 MG tablet Take 1 tablet by mouth every 6 (six) hours as needed for severe pain (pain score 7-10). 01/18/24  Yes Doretha Folks, MD  albuterol  (VENTOLIN  HFA) 108 (90 Base) MCG/ACT inhaler Inhale 1-2 puffs into the lungs every 6 (six) hours as needed for wheezing or shortness of breath. 01/27/22   Tabori, Katherine E, MD  ferrous sulfate 325 (65 FE) MG tablet Take 325 mg by mouth daily.    [provider]  fexofenadine  (ALLEGRA ) 180 MG tablet Take 180 mg by mouth daily.    [provider]  QUEtiapine  (SEROQUEL  XR) 50 MG TB24 24 hr tablet Take 2 tablets (100 mg total) by mouth at bedtime. 09/12/23   Mahlon Comer BRAVO, MD  QUEtiapine  (SEROQUEL ) 25 MG tablet Take 1 tablet (25 mg total) by mouth daily. In addition to the 100mg  Seroquel  XR nightly 09/12/23   Tabori, Katherine E, MD  Rimegepant Sulfate (NURTEC) 75 MG TBDP Take 1 tablet (75 mg total) by mouth as needed. No more than 1 tablet in 24 hours. 08/02/22   Penumalli, Vikram R, MD  sertraline  (ZOLOFT ) 100 MG tablet Take one tablet daily 03/15/23   Tabori, Katherine  E, MD  sertraline  (ZOLOFT ) 25 MG tablet Take 1 tablet (25 mg total) by mouth daily. Take in addition to 100mg  tab for total of 125mg  daily 10/27/22   Mahlon Comer BRAVO, MD    Allergies: Justicia adhatoda, Peanut-containing drug products, Avocado, Cat dander, Dog epithelium, Grass pollen(k-o-r-t-swt vern), Maxalt  [rizatriptan ], Other, Pollen extract, and Hydrocodone-acetaminophen     Review of Systems  Updated Vital Signs BP 129/83   Pulse (!) 105   Temp 98.7 F (37.1 C) (Oral)   Resp 18   LMP 01/14/2024   SpO2 97%   Physical Exam Vitals and nursing note reviewed.  Constitutional:      General: She is not in acute distress.    Appearance: She is well-developed.  HENT:     Head: Normocephalic and atraumatic.  Eyes:     Conjunctiva/sclera: Conjunctivae normal.     Pupils: Pupils are equal, round, and reactive to light.  Cardiovascular:     Rate and Rhythm: Normal rate.     Pulses: Normal pulses.  Pulmonary:     Effort: Pulmonary effort is normal. No respiratory distress.  Musculoskeletal:        General: Tenderness present.     Cervical back: Normal range of motion and neck supple.     Left knee: Bony tenderness present. Tenderness present over the medial joint line and lateral joint line.  Left ankle: Tenderness present over the lateral malleolus, medial malleolus, base of 5th metatarsal and proximal fibula. Decreased range of motion.     Left foot: Decreased range of motion. Tenderness and bony tenderness present.  Skin:    General: Skin is warm and dry.     Findings: No erythema or rash.  Neurological:     Mental Status: She is alert and oriented to person, place, and time.  Psychiatric:        Behavior: Behavior normal.     (all labs ordered are listed, but only abnormal results are displayed) Labs Reviewed - No data to display  EKG: None  Radiology: DG Foot Complete Left Result Date: 01/18/2024 CLINICAL DATA:  fall down the stairs with pain EXAM: LEFT FOOT  - COMPLETE 3+ VIEW COMPARISON:  September 06, 2021, January 05, 2018 FINDINGS: Small ossific fragment along the proximal and lateral navicular bone. No dislocation. There is no evidence of arthropathy or other focal bone abnormality. Moderate soft tissue swelling about the medial ankle and midfoot. IMPRESSION: Small ossific fragment along the proximal and lateral aspect of the navicular bone. While an accessory ossicle can be seen in this region, given the overlying soft tissue swelling, this is worrisome for a small avulsion fracture. Electronically Signed   By: Rogelia Myers M.D.   On: 01/18/2024 08:47   DG Knee Complete 4 Views Left Result Date: 01/18/2024 CLINICAL DATA:  fall down the stairs with pain EXAM: LEFT KNEE - COMPLETE 4+ VIEW COMPARISON:  None Available. FINDINGS: No acute fracture or dislocation. No joint effusion. There is no evidence of arthropathy or other focal bone abnormality. Soft tissues are unremarkable. IMPRESSION: No acute fracture or dislocation. Electronically Signed   By: Rogelia Myers M.D.   On: 01/18/2024 08:40   DG Ankle Complete Left Result Date: 01/18/2024 CLINICAL DATA:  fall down the stairs with pain EXAM: LEFT ANKLE COMPLETE - 3+ VIEW COMPARISON:  June 12, 2017 FINDINGS: No acute fracture or dislocation. No ankle mortise widening. The talar dome is intact. There is no evidence of arthropathy or other focal bone abnormality. Moderate soft tissue swelling about the medial ankle. IMPRESSION: Moderate soft tissue swelling about the medial ankle. No acute fracture or dislocation. Electronically Signed   By: Rogelia Myers M.D.   On: 01/18/2024 08:32     Procedures   Medications Ordered in the ED  oxyCODONE -acetaminophen  (PERCOCET/ROXICET) 5-325 MG per tablet 1 tablet (has no administration in time range)  acetaminophen  (TYLENOL ) tablet 1,000 mg (1,000 mg Oral Given 01/18/24 0826)  ibuprofen  (ADVIL ) tablet 400 mg (400 mg Oral Given 01/18/24 0827)                                     Medical Decision Making Amount and/or Complexity of Data Reviewed Radiology: ordered and independent interpretation performed. Decision-making details documented in ED Course.  Risk OTC drugs. Prescription drug management.   Patient here after falling down the stairs with pain to the left knee foot and ankle.  No significant swelling but significant pain with palpation.  Less than 3-second capillary refill and normal pulse.  Concern for fracture versus sprain.  I have independently visualized and interpreted pt's images today.  X-rays today with soft tissue swelling but no obvious signs of fracture.  Radiology reports negative knee film, left ankle with soft tissue swelling without acute fracture and left foot with a small ossific  fragment along the proximal and lateral aspect of the navicular bone which could be an accessory ossicle or also could be a small avulsion fracture.  Given patient's mechanism and location of pain feel that an avulsion fracture is highly likely.  She was placed in a cam walker and has crutches she can use at home.  She will elevate and remain nonweightbearing for 1 to 2 weeks and follow-up with sports medicine.     Final diagnoses:  Sprain of left ankle, unspecified ligament, initial encounter  Closed avulsion fracture of navicular bone of left foot, initial encounter    ED Discharge Orders          Ordered    oxyCODONE -acetaminophen  (PERCOCET/ROXICET) 5-325 MG tablet  Every 6 hours PRN        01/18/24 0925               Doretha Folks, MD 01/18/24 [phone]
